# Patient Record
Sex: Male | Born: 1945 | Race: White | Hispanic: No | Marital: Married | Smoking: Never smoker
Health system: Southern US, Community
[De-identification: ages and names within clinical notes are randomized; demographics above are authoritative.]

## PROBLEM LIST (undated history)

## (undated) DIAGNOSIS — R011 Cardiac murmur, unspecified: Secondary | ICD-10-CM

## (undated) DIAGNOSIS — G40909 Epilepsy, unspecified, not intractable, without status epilepticus: Secondary | ICD-10-CM

## (undated) DIAGNOSIS — R972 Elevated prostate specific antigen [PSA]: Secondary | ICD-10-CM

## (undated) DIAGNOSIS — E039 Hypothyroidism, unspecified: Secondary | ICD-10-CM

## (undated) DIAGNOSIS — I4891 Unspecified atrial fibrillation: Secondary | ICD-10-CM

## (undated) DIAGNOSIS — E785 Hyperlipidemia, unspecified: Secondary | ICD-10-CM

## (undated) DIAGNOSIS — N400 Enlarged prostate without lower urinary tract symptoms: Secondary | ICD-10-CM

## (undated) DIAGNOSIS — N200 Calculus of kidney: Secondary | ICD-10-CM

## (undated) DIAGNOSIS — K219 Gastro-esophageal reflux disease without esophagitis: Secondary | ICD-10-CM

## (undated) DIAGNOSIS — D510 Vitamin B12 deficiency anemia due to intrinsic factor deficiency: Secondary | ICD-10-CM

## (undated) HISTORY — DX: Unspecified atrial fibrillation: I48.91

## (undated) HISTORY — PX: OTHER SURGICAL HISTORY: SHX169

## (undated) HISTORY — DX: Vitamin B12 deficiency anemia due to intrinsic factor deficiency: D51.0

## (undated) HISTORY — DX: Elevated prostate specific antigen (PSA): R97.20

## (undated) HISTORY — DX: Cardiac murmur, unspecified: R01.1

## (undated) HISTORY — PX: POLYPECTOMY: SHX149

## (undated) HISTORY — DX: Calculus of kidney: N20.0

---

## 2013-07-04 DIAGNOSIS — G40209 Localization-related (focal) (partial) symptomatic epilepsy and epileptic syndromes with complex partial seizures, not intractable, without status epilepticus: Secondary | ICD-10-CM

## 2013-07-04 HISTORY — DX: Localization-related (focal) (partial) symptomatic epilepsy and epileptic syndromes with complex partial seizures, not intractable, without status epilepticus: G40.209

## 2013-09-09 DIAGNOSIS — D234 Other benign neoplasm of skin of scalp and neck: Secondary | ICD-10-CM | POA: Diagnosis not present

## 2013-09-09 DIAGNOSIS — D1801 Hemangioma of skin and subcutaneous tissue: Secondary | ICD-10-CM | POA: Diagnosis not present

## 2013-09-09 DIAGNOSIS — C44611 Basal cell carcinoma of skin of unspecified upper limb, including shoulder: Secondary | ICD-10-CM | POA: Diagnosis not present

## 2013-09-09 DIAGNOSIS — L02818 Cutaneous abscess of other sites: Secondary | ICD-10-CM | POA: Diagnosis not present

## 2013-09-09 DIAGNOSIS — M999 Biomechanical lesion, unspecified: Secondary | ICD-10-CM | POA: Diagnosis not present

## 2013-09-09 DIAGNOSIS — L03818 Cellulitis of other sites: Secondary | ICD-10-CM | POA: Diagnosis not present

## 2013-09-09 DIAGNOSIS — L570 Actinic keratosis: Secondary | ICD-10-CM | POA: Diagnosis not present

## 2014-01-05 DIAGNOSIS — M999 Biomechanical lesion, unspecified: Secondary | ICD-10-CM | POA: Diagnosis not present

## 2014-01-07 DIAGNOSIS — M999 Biomechanical lesion, unspecified: Secondary | ICD-10-CM | POA: Diagnosis not present

## 2014-01-20 ENCOUNTER — Emergency Department (HOSPITAL_BASED_OUTPATIENT_CLINIC_OR_DEPARTMENT_OTHER)
Admission: EM | Admit: 2014-01-20 | Discharge: 2014-01-20 | Disposition: A | Discharge status: Home / Self Care | Payer: Medicare Other | Attending: Emergency Medicine | Admitting: Emergency Medicine

## 2014-01-20 ENCOUNTER — Encounter (HOSPITAL_BASED_OUTPATIENT_CLINIC_OR_DEPARTMENT_OTHER): Payer: Self-pay | Admitting: Emergency Medicine

## 2014-01-20 DIAGNOSIS — G40909 Epilepsy, unspecified, not intractable, without status epilepticus: Secondary | ICD-10-CM | POA: Diagnosis not present

## 2014-01-20 DIAGNOSIS — S61209A Unspecified open wound of unspecified finger without damage to nail, initial encounter: Secondary | ICD-10-CM | POA: Diagnosis not present

## 2014-01-20 DIAGNOSIS — Z23 Encounter for immunization: Secondary | ICD-10-CM | POA: Diagnosis not present

## 2014-01-20 DIAGNOSIS — Z79899 Other long term (current) drug therapy: Secondary | ICD-10-CM | POA: Diagnosis not present

## 2014-01-20 DIAGNOSIS — Y9389 Activity, other specified: Secondary | ICD-10-CM | POA: Insufficient documentation

## 2014-01-20 DIAGNOSIS — W260XXA Contact with knife, initial encounter: Secondary | ICD-10-CM | POA: Insufficient documentation

## 2014-01-20 DIAGNOSIS — S61219A Laceration without foreign body of unspecified finger without damage to nail, initial encounter: Secondary | ICD-10-CM

## 2014-01-20 DIAGNOSIS — Y929 Unspecified place or not applicable: Secondary | ICD-10-CM | POA: Insufficient documentation

## 2014-01-20 DIAGNOSIS — W261XXA Contact with sword or dagger, initial encounter: Secondary | ICD-10-CM

## 2014-01-20 HISTORY — DX: Epilepsy, unspecified, not intractable, without status epilepticus: G40.909

## 2014-01-20 MED ORDER — TETANUS-DIPHTHERIA TOXOIDS TD 5-2 LFU IM INJ
INJECTION | INTRAMUSCULAR | Status: AC
  Filled 2014-01-20: qty 0.5

## 2014-01-20 MED ORDER — TETANUS-DIPHTH-ACELL PERTUSSIS 5-2.5-18.5 LF-MCG/0.5 IM SUSP
INTRAMUSCULAR | Status: AC
  Filled 2014-01-20: qty 0.5

## 2014-01-20 MED ORDER — TETANUS-DIPHTH-ACELL PERTUSSIS 5-2.5-18.5 LF-MCG/0.5 IM SUSP
0.5000 mL | Freq: Once | INTRAMUSCULAR | Status: AC
  Administered 2014-01-20: 0.5 mL via INTRAMUSCULAR
  Filled 2014-01-20: qty 0.5

## 2014-01-20 NOTE — ED Notes (Signed)
Pt sliced tip of right index finger with a knife this a.m. Bleeding controlled at this time.

## 2014-01-20 NOTE — Discharge Instructions (Signed)
Laceration Care, Adult °A laceration is a cut that goes through all layers of the skin. The cut goes into the tissue beneath the skin. °HOME CARE °For stitches (sutures) or staples: °· Keep the cut clean and dry. °· If you have a bandage (dressing), change it at least once a day. Change the bandage if it gets wet or dirty, or as told by your doctor. °· Wash the cut with soap and water 2 times a day. Rinse the cut with water. Pat it dry with a clean towel. °· Put a thin layer of medicated cream on the cut as told by your doctor. °· You may shower after the first 24 hours. Do not soak the cut in water until the stitches are removed. °· Only take medicines as told by your doctor. °· Have your stitches or staples removed as told by your doctor. °For skin adhesive strips: °· Keep the cut clean and dry. °· Do not get the strips wet. You may take a bath, but be careful to keep the cut dry. °· If the cut gets wet, pat it dry with a clean towel. °· The strips will fall off on their own. Do not remove the strips that are still stuck to the cut. °For wound glue: °· You may shower or take baths. Do not soak or scrub the cut. Do not swim. Avoid heavy sweating until the glue falls off on its own. After a shower or bath, pat the cut dry with a clean towel. °· Do not put medicine on your cut until the glue falls off. °· If you have a bandage, do not put tape over the glue. °· Avoid lots of sunlight or tanning lamps until the glue falls off. Put sunscreen on the cut for the first year to reduce your scar. °· The glue will fall off on its own. Do not pick at the glue. °You may need a tetanus shot if: °· You cannot remember when you had your last tetanus shot. °· You have never had a tetanus shot. °If you need a tetanus shot and you choose not to have one, you may get tetanus. Sickness from tetanus can be serious. °GET HELP RIGHT AWAY IF:  °· Your pain does not get better with medicine. °· Your arm, hand, leg, or foot loses feeling  (numbness) or changes color. °· Your cut is bleeding. °· Your joint feels weak, or you cannot use your joint. °· You have painful lumps on your body. °· Your cut is red, puffy (swollen), or painful. °· You have a red line on the skin near the cut. °· You have yellowish-white fluid (pus) coming from the cut. °· You have a fever. °· You have a bad smell coming from the cut or bandage. °· Your cut breaks open before or after stitches are removed. °· You notice something coming out of the cut, such as wood or glass. °· You cannot move a finger or toe. °MAKE SURE YOU:  °· Understand these instructions. °· Will watch your condition. °· Will get help right away if you are not doing well or get worse. °Document Released: 01/24/2008 Document Revised: 10/30/2011 Document Reviewed: 01/31/2011 °[employer]® Patient Information ©2014 [employer]. ° ° ° ° °

## 2014-01-20 NOTE — ED Notes (Signed)
MD at bedside. 

## 2014-01-20 NOTE — ED Provider Notes (Signed)
CSN: [id number]     Arrival date & time 01/20/14  6213 History   First MD Initiated Contact with Patient 01/20/14 [phone]     Chief Complaint  Patient presents with  . Laceration     (Consider location/radiation/quality/duration/timing/severity/associated sxs/prior Treatment) Patient is a 68 y.o. male presenting with skin laceration. The history is provided by the patient.  Laceration Location:  Hand and finger Hand laceration location:  L hand Length (cm):  0.3 Depth:  Cutaneous Quality: straight   Bleeding: controlled   Pain details:    Severity:  No pain   Progression:  Unchanged Foreign body present:  No foreign bodies Relieved by:  Pressure Worsened by:  Nothing tried Ineffective treatments:  None tried Tetanus status:  Out of date   Past Medical History  Diagnosis Date  . Epilepsy    Past Surgical History  Procedure Laterality Date  . Kidney stone removal     No family history on file. History  Substance Use Topics  . Smoking status: Never Smoker   . Smokeless tobacco: Not on file  . Alcohol Use: No    Review of Systems    Allergies  Review of patient's allergies indicates no known allergies.  Home Medications   Prior to Admission medications   Medication Sig Start Date End Date Taking? Authorizing Provider  PHENObarbital (LUMINAL) 30 MG tablet Take 30 mg by mouth 4 (four) times daily.   Yes Historical Provider, MD  phenytoin (DILANTIN) 100 MG ER capsule Take by mouth 3 (three) times daily.   Yes Historical Provider, MD   BP 130/80  Pulse 95  Temp(Src) 97.5 F (36.4 C) (Oral)  Resp 16  Ht 5\' 11"  (1.803 m)  Wt 185 lb (83.915 kg)  BMI 25.81 kg/m2  SpO2 97% Physical Exam  Nursing note and vitals reviewed. Constitutional: He is oriented to person, place, and time. He appears well-developed and well-nourished.  HENT:  Head: Normocephalic and atraumatic.  Cardiovascular: Normal rate.   Pulmonary/Chest: Effort normal.  Musculoskeletal: He exhibits  no edema and no tenderness.       Hands: Neurological: He is alert and oriented to person, place, and time.  Skin: Skin is warm and dry.  Psychiatric: He has a normal mood and affect.    ED Course  Procedures (including critical care time) Labs Review Labs Reviewed - No data to display  Imaging Review No results found.   EKG Interpretation None      MDM   Final diagnoses:  Finger laceration        Shaune Pollack, MD 01/20/14 1000

## 2014-01-21 DIAGNOSIS — M999 Biomechanical lesion, unspecified: Secondary | ICD-10-CM | POA: Diagnosis not present

## 2014-02-04 DIAGNOSIS — Z23 Encounter for immunization: Secondary | ICD-10-CM | POA: Diagnosis not present

## 2014-02-04 DIAGNOSIS — E7800 Pure hypercholesterolemia, unspecified: Secondary | ICD-10-CM | POA: Diagnosis not present

## 2014-02-04 DIAGNOSIS — Z79899 Other long term (current) drug therapy: Secondary | ICD-10-CM | POA: Diagnosis not present

## 2014-02-04 DIAGNOSIS — R5381 Other malaise: Secondary | ICD-10-CM | POA: Diagnosis not present

## 2014-02-04 DIAGNOSIS — Z125 Encounter for screening for malignant neoplasm of prostate: Secondary | ICD-10-CM | POA: Diagnosis not present

## 2014-03-19 ENCOUNTER — Other Ambulatory Visit (HOSPITAL_COMMUNITY): Payer: Self-pay | Admitting: Urology

## 2014-03-19 DIAGNOSIS — R972 Elevated prostate specific antigen [PSA]: Secondary | ICD-10-CM

## 2014-04-08 ENCOUNTER — Ambulatory Visit (HOSPITAL_COMMUNITY): Payer: Medicare Other

## 2014-04-09 ENCOUNTER — Ambulatory Visit (HOSPITAL_COMMUNITY)
Admission: RE | Admit: 2014-04-09 | Discharge: 2014-04-09 | Disposition: A | Discharge status: Home / Self Care | Payer: Medicare Other | Source: Ambulatory Visit | Attending: Urology | Admitting: Urology

## 2014-04-09 DIAGNOSIS — M899 Disorder of bone, unspecified: Secondary | ICD-10-CM | POA: Diagnosis not present

## 2014-04-09 DIAGNOSIS — R972 Elevated prostate specific antigen [PSA]: Secondary | ICD-10-CM

## 2014-04-09 DIAGNOSIS — M949 Disorder of cartilage, unspecified: Secondary | ICD-10-CM

## 2014-04-09 DIAGNOSIS — N400 Enlarged prostate without lower urinary tract symptoms: Secondary | ICD-10-CM | POA: Insufficient documentation

## 2014-04-09 MED ORDER — GADOBENATE DIMEGLUMINE 529 MG/ML IV SOLN
19.0000 mL | Freq: Once | INTRAVENOUS | Status: AC | PRN
  Administered 2014-04-09: 19 mL via INTRAVENOUS

## 2014-04-10 DIAGNOSIS — D219 Benign neoplasm of connective and other soft tissue, unspecified: Secondary | ICD-10-CM | POA: Diagnosis not present

## 2014-04-10 DIAGNOSIS — H9319 Tinnitus, unspecified ear: Secondary | ICD-10-CM | POA: Diagnosis not present

## 2014-04-10 DIAGNOSIS — J342 Deviated nasal septum: Secondary | ICD-10-CM | POA: Diagnosis not present

## 2014-04-10 DIAGNOSIS — H9120 Sudden idiopathic hearing loss, unspecified ear: Secondary | ICD-10-CM | POA: Diagnosis not present

## 2014-04-14 DIAGNOSIS — H9319 Tinnitus, unspecified ear: Secondary | ICD-10-CM | POA: Diagnosis not present

## 2014-04-14 DIAGNOSIS — H903 Sensorineural hearing loss, bilateral: Secondary | ICD-10-CM | POA: Diagnosis not present

## 2014-04-15 DIAGNOSIS — R972 Elevated prostate specific antigen [PSA]: Secondary | ICD-10-CM | POA: Diagnosis not present

## 2014-04-15 DIAGNOSIS — N401 Enlarged prostate with lower urinary tract symptoms: Secondary | ICD-10-CM | POA: Insufficient documentation

## 2014-04-15 DIAGNOSIS — N138 Other obstructive and reflux uropathy: Secondary | ICD-10-CM

## 2014-04-15 HISTORY — DX: Benign prostatic hyperplasia with lower urinary tract symptoms: N13.8

## 2014-05-07 DIAGNOSIS — N429 Disorder of prostate, unspecified: Secondary | ICD-10-CM | POA: Diagnosis not present

## 2014-05-07 DIAGNOSIS — Z0389 Encounter for observation for other suspected diseases and conditions ruled out: Secondary | ICD-10-CM | POA: Diagnosis not present

## 2014-05-07 DIAGNOSIS — R972 Elevated prostate specific antigen [PSA]: Secondary | ICD-10-CM | POA: Diagnosis not present

## 2014-05-20 DIAGNOSIS — R972 Elevated prostate specific antigen [PSA]: Secondary | ICD-10-CM | POA: Diagnosis not present

## 2014-05-21 DIAGNOSIS — Z23 Encounter for immunization: Secondary | ICD-10-CM | POA: Diagnosis not present

## 2014-07-03 DIAGNOSIS — G40209 Localization-related (focal) (partial) symptomatic epilepsy and epileptic syndromes with complex partial seizures, not intractable, without status epilepticus: Secondary | ICD-10-CM | POA: Diagnosis not present

## 2014-09-09 DIAGNOSIS — C44612 Basal cell carcinoma of skin of right upper limb, including shoulder: Secondary | ICD-10-CM | POA: Diagnosis not present

## 2014-09-09 DIAGNOSIS — L578 Other skin changes due to chronic exposure to nonionizing radiation: Secondary | ICD-10-CM | POA: Diagnosis not present

## 2014-09-09 DIAGNOSIS — L821 Other seborrheic keratosis: Secondary | ICD-10-CM | POA: Diagnosis not present

## 2014-09-09 DIAGNOSIS — L820 Inflamed seborrheic keratosis: Secondary | ICD-10-CM | POA: Diagnosis not present

## 2014-09-14 DIAGNOSIS — M9905 Segmental and somatic dysfunction of pelvic region: Secondary | ICD-10-CM | POA: Diagnosis not present

## 2014-09-14 DIAGNOSIS — M9904 Segmental and somatic dysfunction of sacral region: Secondary | ICD-10-CM | POA: Diagnosis not present

## 2014-09-14 DIAGNOSIS — M9903 Segmental and somatic dysfunction of lumbar region: Secondary | ICD-10-CM | POA: Diagnosis not present

## 2014-09-14 DIAGNOSIS — M9902 Segmental and somatic dysfunction of thoracic region: Secondary | ICD-10-CM | POA: Diagnosis not present

## 2014-09-15 DIAGNOSIS — M9905 Segmental and somatic dysfunction of pelvic region: Secondary | ICD-10-CM | POA: Diagnosis not present

## 2014-09-15 DIAGNOSIS — M9903 Segmental and somatic dysfunction of lumbar region: Secondary | ICD-10-CM | POA: Diagnosis not present

## 2014-09-15 DIAGNOSIS — M9904 Segmental and somatic dysfunction of sacral region: Secondary | ICD-10-CM | POA: Diagnosis not present

## 2014-09-15 DIAGNOSIS — M9902 Segmental and somatic dysfunction of thoracic region: Secondary | ICD-10-CM | POA: Diagnosis not present

## 2014-10-12 DIAGNOSIS — B9689 Other specified bacterial agents as the cause of diseases classified elsewhere: Secondary | ICD-10-CM | POA: Diagnosis not present

## 2014-10-12 DIAGNOSIS — J019 Acute sinusitis, unspecified: Secondary | ICD-10-CM | POA: Diagnosis not present

## 2014-10-26 DIAGNOSIS — Z129 Encounter for screening for malignant neoplasm, site unspecified: Secondary | ICD-10-CM | POA: Diagnosis not present

## 2014-10-29 DIAGNOSIS — Z6825 Body mass index (BMI) 25.0-25.9, adult: Secondary | ICD-10-CM | POA: Diagnosis not present

## 2014-10-29 DIAGNOSIS — R972 Elevated prostate specific antigen [PSA]: Secondary | ICD-10-CM | POA: Diagnosis not present

## 2015-01-25 DIAGNOSIS — M9903 Segmental and somatic dysfunction of lumbar region: Secondary | ICD-10-CM | POA: Diagnosis not present

## 2015-01-25 DIAGNOSIS — M9902 Segmental and somatic dysfunction of thoracic region: Secondary | ICD-10-CM | POA: Diagnosis not present

## 2015-01-25 DIAGNOSIS — M9905 Segmental and somatic dysfunction of pelvic region: Secondary | ICD-10-CM | POA: Diagnosis not present

## 2015-01-25 DIAGNOSIS — M9904 Segmental and somatic dysfunction of sacral region: Secondary | ICD-10-CM | POA: Diagnosis not present

## 2015-02-02 DIAGNOSIS — M9903 Segmental and somatic dysfunction of lumbar region: Secondary | ICD-10-CM | POA: Diagnosis not present

## 2015-02-02 DIAGNOSIS — M9904 Segmental and somatic dysfunction of sacral region: Secondary | ICD-10-CM | POA: Diagnosis not present

## 2015-02-02 DIAGNOSIS — M9902 Segmental and somatic dysfunction of thoracic region: Secondary | ICD-10-CM | POA: Diagnosis not present

## 2015-02-02 DIAGNOSIS — M9905 Segmental and somatic dysfunction of pelvic region: Secondary | ICD-10-CM | POA: Diagnosis not present

## 2015-02-03 DIAGNOSIS — M9905 Segmental and somatic dysfunction of pelvic region: Secondary | ICD-10-CM | POA: Diagnosis not present

## 2015-02-03 DIAGNOSIS — M9904 Segmental and somatic dysfunction of sacral region: Secondary | ICD-10-CM | POA: Diagnosis not present

## 2015-02-03 DIAGNOSIS — M9902 Segmental and somatic dysfunction of thoracic region: Secondary | ICD-10-CM | POA: Diagnosis not present

## 2015-02-03 DIAGNOSIS — M9903 Segmental and somatic dysfunction of lumbar region: Secondary | ICD-10-CM | POA: Diagnosis not present

## 2015-02-05 DIAGNOSIS — M9905 Segmental and somatic dysfunction of pelvic region: Secondary | ICD-10-CM | POA: Diagnosis not present

## 2015-02-05 DIAGNOSIS — M9902 Segmental and somatic dysfunction of thoracic region: Secondary | ICD-10-CM | POA: Diagnosis not present

## 2015-02-05 DIAGNOSIS — M9903 Segmental and somatic dysfunction of lumbar region: Secondary | ICD-10-CM | POA: Diagnosis not present

## 2015-02-05 DIAGNOSIS — M9904 Segmental and somatic dysfunction of sacral region: Secondary | ICD-10-CM | POA: Diagnosis not present

## 2015-03-08 DIAGNOSIS — L720 Epidermal cyst: Secondary | ICD-10-CM | POA: Diagnosis not present

## 2015-03-08 DIAGNOSIS — L821 Other seborrheic keratosis: Secondary | ICD-10-CM | POA: Diagnosis not present

## 2015-04-23 DIAGNOSIS — M9905 Segmental and somatic dysfunction of pelvic region: Secondary | ICD-10-CM | POA: Diagnosis not present

## 2015-04-23 DIAGNOSIS — M9902 Segmental and somatic dysfunction of thoracic region: Secondary | ICD-10-CM | POA: Diagnosis not present

## 2015-04-23 DIAGNOSIS — M9904 Segmental and somatic dysfunction of sacral region: Secondary | ICD-10-CM | POA: Diagnosis not present

## 2015-04-23 DIAGNOSIS — M9903 Segmental and somatic dysfunction of lumbar region: Secondary | ICD-10-CM | POA: Diagnosis not present

## 2015-07-19 DIAGNOSIS — E7800 Pure hypercholesterolemia, unspecified: Secondary | ICD-10-CM | POA: Diagnosis not present

## 2015-07-19 DIAGNOSIS — R5383 Other fatigue: Secondary | ICD-10-CM | POA: Diagnosis not present

## 2015-07-19 DIAGNOSIS — J111 Influenza due to unidentified influenza virus with other respiratory manifestations: Secondary | ICD-10-CM | POA: Diagnosis not present

## 2015-07-22 DIAGNOSIS — Z6825 Body mass index (BMI) 25.0-25.9, adult: Secondary | ICD-10-CM | POA: Diagnosis not present

## 2015-07-22 DIAGNOSIS — G40209 Localization-related (focal) (partial) symptomatic epilepsy and epileptic syndromes with complex partial seizures, not intractable, without status epilepticus: Secondary | ICD-10-CM | POA: Diagnosis not present

## 2015-08-26 DIAGNOSIS — Z23 Encounter for immunization: Secondary | ICD-10-CM | POA: Diagnosis not present

## 2015-09-10 DIAGNOSIS — D1801 Hemangioma of skin and subcutaneous tissue: Secondary | ICD-10-CM | POA: Diagnosis not present

## 2015-09-10 DIAGNOSIS — L720 Epidermal cyst: Secondary | ICD-10-CM | POA: Diagnosis not present

## 2015-09-10 DIAGNOSIS — M9905 Segmental and somatic dysfunction of pelvic region: Secondary | ICD-10-CM | POA: Diagnosis not present

## 2015-09-10 DIAGNOSIS — M9902 Segmental and somatic dysfunction of thoracic region: Secondary | ICD-10-CM | POA: Diagnosis not present

## 2015-09-10 DIAGNOSIS — L570 Actinic keratosis: Secondary | ICD-10-CM | POA: Diagnosis not present

## 2015-09-10 DIAGNOSIS — M9903 Segmental and somatic dysfunction of lumbar region: Secondary | ICD-10-CM | POA: Diagnosis not present

## 2015-09-10 DIAGNOSIS — L578 Other skin changes due to chronic exposure to nonionizing radiation: Secondary | ICD-10-CM | POA: Diagnosis not present

## 2015-09-10 DIAGNOSIS — M9904 Segmental and somatic dysfunction of sacral region: Secondary | ICD-10-CM | POA: Diagnosis not present

## 2015-09-10 DIAGNOSIS — L821 Other seborrheic keratosis: Secondary | ICD-10-CM | POA: Diagnosis not present

## 2015-09-17 DIAGNOSIS — R972 Elevated prostate specific antigen [PSA]: Secondary | ICD-10-CM | POA: Diagnosis not present

## 2015-09-17 DIAGNOSIS — N401 Enlarged prostate with lower urinary tract symptoms: Secondary | ICD-10-CM | POA: Diagnosis not present

## 2015-09-17 DIAGNOSIS — Z6825 Body mass index (BMI) 25.0-25.9, adult: Secondary | ICD-10-CM | POA: Diagnosis not present

## 2015-09-17 DIAGNOSIS — N138 Other obstructive and reflux uropathy: Secondary | ICD-10-CM | POA: Insufficient documentation

## 2015-11-10 DIAGNOSIS — Z79899 Other long term (current) drug therapy: Secondary | ICD-10-CM | POA: Diagnosis not present

## 2015-11-10 DIAGNOSIS — Z0000 Encounter for general adult medical examination without abnormal findings: Secondary | ICD-10-CM | POA: Diagnosis not present

## 2015-11-10 DIAGNOSIS — E785 Hyperlipidemia, unspecified: Secondary | ICD-10-CM | POA: Diagnosis not present

## 2015-11-10 DIAGNOSIS — R972 Elevated prostate specific antigen [PSA]: Secondary | ICD-10-CM | POA: Diagnosis not present

## 2015-11-10 DIAGNOSIS — Z23 Encounter for immunization: Secondary | ICD-10-CM | POA: Diagnosis not present

## 2015-11-15 DIAGNOSIS — M9902 Segmental and somatic dysfunction of thoracic region: Secondary | ICD-10-CM | POA: Diagnosis not present

## 2015-11-15 DIAGNOSIS — M9905 Segmental and somatic dysfunction of pelvic region: Secondary | ICD-10-CM | POA: Diagnosis not present

## 2015-11-15 DIAGNOSIS — M9903 Segmental and somatic dysfunction of lumbar region: Secondary | ICD-10-CM | POA: Diagnosis not present

## 2015-11-15 DIAGNOSIS — M9904 Segmental and somatic dysfunction of sacral region: Secondary | ICD-10-CM | POA: Diagnosis not present

## 2015-12-07 DIAGNOSIS — M9902 Segmental and somatic dysfunction of thoracic region: Secondary | ICD-10-CM | POA: Diagnosis not present

## 2015-12-07 DIAGNOSIS — M9903 Segmental and somatic dysfunction of lumbar region: Secondary | ICD-10-CM | POA: Diagnosis not present

## 2015-12-07 DIAGNOSIS — M9904 Segmental and somatic dysfunction of sacral region: Secondary | ICD-10-CM | POA: Diagnosis not present

## 2015-12-07 DIAGNOSIS — M9905 Segmental and somatic dysfunction of pelvic region: Secondary | ICD-10-CM | POA: Diagnosis not present

## 2016-01-06 DIAGNOSIS — L270 Generalized skin eruption due to drugs and medicaments taken internally: Secondary | ICD-10-CM | POA: Diagnosis not present

## 2016-01-26 DIAGNOSIS — M9903 Segmental and somatic dysfunction of lumbar region: Secondary | ICD-10-CM | POA: Diagnosis not present

## 2016-01-26 DIAGNOSIS — M9904 Segmental and somatic dysfunction of sacral region: Secondary | ICD-10-CM | POA: Diagnosis not present

## 2016-01-26 DIAGNOSIS — M9902 Segmental and somatic dysfunction of thoracic region: Secondary | ICD-10-CM | POA: Diagnosis not present

## 2016-01-26 DIAGNOSIS — M9905 Segmental and somatic dysfunction of pelvic region: Secondary | ICD-10-CM | POA: Diagnosis not present

## 2016-02-01 DIAGNOSIS — M9904 Segmental and somatic dysfunction of sacral region: Secondary | ICD-10-CM | POA: Diagnosis not present

## 2016-02-01 DIAGNOSIS — M9903 Segmental and somatic dysfunction of lumbar region: Secondary | ICD-10-CM | POA: Diagnosis not present

## 2016-02-01 DIAGNOSIS — M9905 Segmental and somatic dysfunction of pelvic region: Secondary | ICD-10-CM | POA: Diagnosis not present

## 2016-02-01 DIAGNOSIS — M9902 Segmental and somatic dysfunction of thoracic region: Secondary | ICD-10-CM | POA: Diagnosis not present

## 2016-02-04 DIAGNOSIS — M9903 Segmental and somatic dysfunction of lumbar region: Secondary | ICD-10-CM | POA: Diagnosis not present

## 2016-02-04 DIAGNOSIS — M9902 Segmental and somatic dysfunction of thoracic region: Secondary | ICD-10-CM | POA: Diagnosis not present

## 2016-02-04 DIAGNOSIS — M9904 Segmental and somatic dysfunction of sacral region: Secondary | ICD-10-CM | POA: Diagnosis not present

## 2016-02-04 DIAGNOSIS — M9905 Segmental and somatic dysfunction of pelvic region: Secondary | ICD-10-CM | POA: Diagnosis not present

## 2016-03-17 DIAGNOSIS — M9902 Segmental and somatic dysfunction of thoracic region: Secondary | ICD-10-CM | POA: Diagnosis not present

## 2016-03-17 DIAGNOSIS — M9904 Segmental and somatic dysfunction of sacral region: Secondary | ICD-10-CM | POA: Diagnosis not present

## 2016-03-17 DIAGNOSIS — M9903 Segmental and somatic dysfunction of lumbar region: Secondary | ICD-10-CM | POA: Diagnosis not present

## 2016-03-17 DIAGNOSIS — M9905 Segmental and somatic dysfunction of pelvic region: Secondary | ICD-10-CM | POA: Diagnosis not present

## 2016-04-03 DIAGNOSIS — M9903 Segmental and somatic dysfunction of lumbar region: Secondary | ICD-10-CM | POA: Diagnosis not present

## 2016-04-03 DIAGNOSIS — M9902 Segmental and somatic dysfunction of thoracic region: Secondary | ICD-10-CM | POA: Diagnosis not present

## 2016-04-03 DIAGNOSIS — M9904 Segmental and somatic dysfunction of sacral region: Secondary | ICD-10-CM | POA: Diagnosis not present

## 2016-04-03 DIAGNOSIS — M9905 Segmental and somatic dysfunction of pelvic region: Secondary | ICD-10-CM | POA: Diagnosis not present

## 2016-06-14 DIAGNOSIS — L570 Actinic keratosis: Secondary | ICD-10-CM | POA: Diagnosis not present

## 2016-07-06 ENCOUNTER — Encounter: Payer: Self-pay | Admitting: Sports Medicine

## 2016-07-06 ENCOUNTER — Ambulatory Visit (INDEPENDENT_AMBULATORY_CARE_PROVIDER_SITE_OTHER): Payer: Medicare Other

## 2016-07-06 ENCOUNTER — Ambulatory Visit (INDEPENDENT_AMBULATORY_CARE_PROVIDER_SITE_OTHER): Payer: Medicare Other | Admitting: Sports Medicine

## 2016-07-06 DIAGNOSIS — M79671 Pain in right foot: Secondary | ICD-10-CM | POA: Diagnosis not present

## 2016-07-06 DIAGNOSIS — M19079 Primary osteoarthritis, unspecified ankle and foot: Secondary | ICD-10-CM | POA: Diagnosis not present

## 2016-07-06 DIAGNOSIS — M779 Enthesopathy, unspecified: Secondary | ICD-10-CM

## 2016-07-06 DIAGNOSIS — M7750 Other enthesopathy of unspecified foot: Secondary | ICD-10-CM | POA: Diagnosis not present

## 2016-07-06 NOTE — Patient Instructions (Signed)
Recommend Rest, epsom soaks, ice, elevation, and protection with compression sleeve Avoid activities that will irritate your foot Return to office if not improved

## 2016-07-06 NOTE — Progress Notes (Signed)
Subjective: Edward Boone is a 70 y.o. male patient who presents to office for evaluation of right foot pain. Patient complains of progressive pain that is now better since yesterday. States that he was in the yard raking leaves and afterwards had intense pain along the side of the right foot. States that he has done nothing for the pain, however, pain is now better now 4/10. Patient denies any other pedal complaints. Denies any other causative factors.   Patient Active Problem List   Diagnosis Date Noted  . Benign prostatic hyperplasia with urinary obstruction 09/17/2015  . Hypertrophy of prostate with urinary obstruction and other lower urinary tract symptoms (LUTS) 04/15/2014  . Elevated PSA 03/19/2014  . 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   No current facility-administered medications on file prior to visit.     No Known Allergies  Objective:  General: Alert and oriented x3 in no acute distress  Dermatology: No open lesions bilateral lower extremities, no webspace macerations, no ecchymosis bilateral, all nails x 10 are well manicured.  Vascular: Dorsalis Pedis and Posterior Tibial pedal pulses palpable, Capillary Fill Time 3 seconds,Scant pedal hair growth bilateral, minimal edema bilateral lower extremities, varicosities bilateral, Temperature gradient within normal limits.  Neurology: Edward Boone sensation intact via light touch bilateral (.- )Tinels sign bilateral.   Musculoskeletal: Mild tenderness with palpation atperoneal brevis insertion and fifth metatarsal base on right foot. There is no pain with calf compression bilateral. There is decreased  midtarsal and ankle rom with knee extending  vs flexed resembling gastroc equnius bilateral, Subtalar joint range of motion is within normal limits, there isno reproducible pain with stressing midtarsal joint. However, a little bit of tenderness  with inversion on right foot.Strength within normal limits in all groups bilateral.    Xrays   Right Foot   Impression: normal osseous mineralization with joint space narrowing at midtarsal joint and diffuse arthritis especially involved at the fifth metatarsal base with history of enthesopathy, no fracture or frank dislocation, soft tissue margins within normal limits.   Assessment and Plan: Problem List Items Addressed This Visit    None    Visit Diagnoses    Right foot pain    -  Primary   Relevant Orders   DG Foot 2 Views Right   Tendonitis       Arthritis of foot          -Complete examination performed -Xrays reviewed -Discussed treatement options for likely tendinitis secondary to position that he was standing in while raking leaves  -Patient declined medication or steroid injection to site  -Patient declined taping or bracing  -Recommend Rest, epsom soaks, ice, elevation, and protection with compression sleeve of which surgi tube was given -Recommend to avoid activities that will irritate foot, especially uneven surfaces and strenuous exercise until symptoms are resolved -Patient to return to office as needed or sooner if condition worsens.  Landis Martins, DPM

## 2016-07-20 DIAGNOSIS — Z6825 Body mass index (BMI) 25.0-25.9, adult: Secondary | ICD-10-CM | POA: Diagnosis not present

## 2016-07-20 DIAGNOSIS — Z79899 Other long term (current) drug therapy: Secondary | ICD-10-CM | POA: Diagnosis not present

## 2016-07-20 DIAGNOSIS — G40209 Localization-related (focal) (partial) symptomatic epilepsy and epileptic syndromes with complex partial seizures, not intractable, without status epilepticus: Secondary | ICD-10-CM | POA: Diagnosis not present

## 2016-07-26 ENCOUNTER — Ambulatory Visit (INDEPENDENT_AMBULATORY_CARE_PROVIDER_SITE_OTHER): Payer: Medicare Other | Admitting: Sports Medicine

## 2016-07-26 ENCOUNTER — Encounter: Payer: Self-pay | Admitting: Sports Medicine

## 2016-07-26 DIAGNOSIS — M79671 Pain in right foot: Secondary | ICD-10-CM

## 2016-07-26 DIAGNOSIS — R609 Edema, unspecified: Secondary | ICD-10-CM

## 2016-07-26 DIAGNOSIS — M7750 Other enthesopathy of unspecified foot: Secondary | ICD-10-CM | POA: Diagnosis not present

## 2016-07-26 DIAGNOSIS — M779 Enthesopathy, unspecified: Secondary | ICD-10-CM

## 2016-07-26 DIAGNOSIS — Z79899 Other long term (current) drug therapy: Secondary | ICD-10-CM | POA: Diagnosis not present

## 2016-07-26 MED ORDER — TRIAMCINOLONE ACETONIDE 40 MG/ML IJ SUSP: 20.0000 mg | Freq: Once | INTRAMUSCULAR | Status: DC

## 2016-07-26 NOTE — Progress Notes (Signed)
  Subjective: Edward Boone is a 70 y.o. male patient who presents to office for follow up evaluation of right foot pain, request injection, States that he was raking the leaves again and hit his foot and now has has pain and swelling, pain now 5/10. Patient denies any other pedal complaints.    Patient Active Problem List   Diagnosis Date Noted  . Benign prostatic hyperplasia with urinary obstruction 09/17/2015  . Hypertrophy of prostate with urinary obstruction and other lower urinary tract symptoms (LUTS) 04/15/2014  . Elevated PSA 03/19/2014  . 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 dutasteride (AVODART) 0.5 MG capsule     . finasteride (PROSCAR) 5 MG tablet     . Influenza Vac Split High-Dose 0.5 ML SUSY     . levothyroxine (SYNTHROID, LEVOTHROID) 25 MCG tablet Frequency:daily   Dosage:25   MCG  Instructions:Levothyroxine Sodium (25MCG TABS, 1 Oral daily)  Note:    . PHENObarbital (LUMINAL) 30 MG tablet Take 30 mg by mouth.    . phenytoin (DILANTIN) 100 MG ER capsule Take 300 mg by mouth.     No current facility-administered medications on file prior to visit.     No Known Allergies  Objective:  General: Alert and oriented x3 in no acute distress  Dermatology: No open lesions bilateral lower extremities, no webspace macerations, no ecchymosis bilateral, all nails x 10 are well manicured.  Vascular: Dorsalis Pedis and Posterior Tibial pedal pulses palpable, Capillary Fill Time 3 seconds,Scant pedal hair growth bilateral, minimal edema bilateral lower extremities R>L, varicosities bilateral, Temperature gradient within normal limits.  Neurology: [name] sensation intact via light touch bilateral (.- )Tinels sign bilateral.   Musculoskeletal: Mild tenderness with palpation at peroneal brevis insertion and fifth metatarsal base on right foot. There is no pain  with calf compression bilateral. There is decreased  midtarsal and ankle rom with knee extending  vs flexed resembling gastroc equnius bilateral, Subtalar joint range of motion is within normal limits, there isno reproducible pain with stressing midtarsal joint. However, a little bit of tenderness with inversion on right foot.Strength within normal limits in all groups bilateral.   Assessment and Plan: Problem List Items Addressed This Visit    None    Visit Diagnoses    Tendonitis    -  Primary   Relevant Medications   triamcinolone acetonide (KENALOG-40) injection 20 mg   Right foot pain       Relevant Medications   triamcinolone acetonide (KENALOG-40) injection 20 mg   Swelling          -Complete examination performed -Previous Xrays reviewed -Discussed treatement options for likely tendinitis secondary inversion and direct stub injury -After oral consent and aseptic prep, injected a mixture containing 1 ml of 2% plain lidocaine, 1 ml 0.5% plain marcaine, 0.5 ml of kenalog 40 and 0.5 ml of dexamethasone phosphate into Right 5th met base at area of most pain- without complication. Post-injection care discussed with patient.  -Applied unna boot to keep intact for 5-6 days on right foot -Dispensed post op shoe -Recommend Rest, ice, elevation, and protection  -Recommend to avoid activities that will irritate foot, especially uneven surfaces and strenuous exercise until symptoms are resolved -Patient to return to office in 2 weeks or sooner if condition worsens.  Landis Martins, DPM

## 2016-07-26 NOTE — Patient Instructions (Signed)
STRAPPING INSTRUCTIONS  Strapping's need to be worn for 5-7 days for maximum benefit.  If you next appointment is before 5 days, please remove prior to coming in.  Try to avoid putting lotion on feet during the unna boot/ taping process.  It is important that you wear sturdy shoes at all times when walking.  Bedroom shoes, slippers, flip flops, etc. are not acceptable.  **If at any time while wearing the strapping you should notice any irritation such as a rash, redness, or itching, remove the wrap and wash your foot/feet thoroughly.  BATHING INSTRUCTIONS  If your foot gets wet, take a towel and absorb as much of the water as possible. You can also take a blow dryer, put it on low heat, and dry your bandage.

## 2016-08-09 ENCOUNTER — Encounter: Payer: Self-pay | Admitting: Sports Medicine

## 2016-08-09 ENCOUNTER — Ambulatory Visit (INDEPENDENT_AMBULATORY_CARE_PROVIDER_SITE_OTHER): Payer: Medicare Other | Admitting: Sports Medicine

## 2016-08-09 DIAGNOSIS — M19079 Primary osteoarthritis, unspecified ankle and foot: Secondary | ICD-10-CM | POA: Diagnosis not present

## 2016-08-09 DIAGNOSIS — M779 Enthesopathy, unspecified: Secondary | ICD-10-CM | POA: Diagnosis not present

## 2016-08-09 DIAGNOSIS — Z23 Encounter for immunization: Secondary | ICD-10-CM | POA: Diagnosis not present

## 2016-08-09 DIAGNOSIS — M79671 Pain in right foot: Secondary | ICD-10-CM

## 2016-08-09 NOTE — Progress Notes (Signed)
  Subjective: Edward Boone is a 70 y.o. male patient who presents to office for follow up evaluation of right foot pain, states that he is doing better and pain is better. Wore unna boot for 10 days. Patient denies any other pedal complaints.    Patient Active Problem List   Diagnosis Date Noted  . Benign prostatic hyperplasia with urinary obstruction 09/17/2015  . Hypertrophy of prostate with urinary obstruction and other lower urinary tract symptoms (LUTS) 04/15/2014  . Elevated PSA 03/19/2014  . 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 dutasteride (AVODART) 0.5 MG capsule     . finasteride (PROSCAR) 5 MG tablet     . Influenza Vac Split High-Dose 0.5 ML SUSY     . levothyroxine (SYNTHROID, LEVOTHROID) 25 MCG tablet Frequency:daily   Dosage:25   MCG  Instructions:Levothyroxine Sodium (25MCG TABS, 1 Oral daily)  Note:    . PHENObarbital (LUMINAL) 30 MG tablet Take 30 mg by mouth.    . phenytoin (DILANTIN) 100 MG ER capsule Take 300 mg by mouth.     Current Facility-Administered Medications on File Prior to Visit  Medication Dose Route Frequency Provider Last Rate Last Dose  . triamcinolone acetonide (KENALOG-40) injection 20 mg  20 mg Other Once [employer], DPM        No Known Allergies  Objective:  General: Alert and oriented x3 in no acute distress  Dermatology: No open lesions bilateral lower extremities, no webspace macerations, no ecchymosis bilateral, all nails x 10 are well manicured.  Vascular: Dorsalis Pedis and Posterior Tibial pedal pulses palpable, Capillary Fill Time 3 seconds,Scant pedal hair growth bilateral, improved edema bilateral lower extremities R>L, varicosities bilateral, Temperature gradient within normal limits.  Neurology: [name] sensation intact via light touch bilateral (.- )Tinels sign bilateral.   Musculoskeletal: No tenderness  with palpation at peroneal brevis insertion and fifth metatarsal base on right foot. There is no pain with calf compression bilateral. There is decreased  midtarsal and ankle rom with knee extending  vs flexed resembling gastroc equnius bilateral, Subtalar joint range of motion is within normal limits, there is no reproducible pain with stressing midtarsal joint. Strength within normal limits in all groups bilateral.   Assessment and Plan: Problem List Items Addressed This Visit    None    Visit Diagnoses    Tendonitis    -  Primary   Right foot pain       Arthritis of foot          -Complete examination performed -Discussed long term care for tendinitis -Recommend daily stretching -Recommend Rest, ice, elevation, and protection -May use tens unit that he has at [facility]  -Recommend to avoid activities that will irritate foot, especially uneven surfaces and strenuous exercise until symptoms are resolved -Patient to return to office as needed or sooner if condition worsens.  Landis Martins, DPM

## 2016-10-18 DIAGNOSIS — H8113 Benign paroxysmal vertigo, bilateral: Secondary | ICD-10-CM | POA: Diagnosis not present

## 2016-10-18 DIAGNOSIS — R030 Elevated blood-pressure reading, without diagnosis of hypertension: Secondary | ICD-10-CM | POA: Diagnosis not present

## 2016-10-31 DIAGNOSIS — M9904 Segmental and somatic dysfunction of sacral region: Secondary | ICD-10-CM | POA: Diagnosis not present

## 2016-10-31 DIAGNOSIS — M9902 Segmental and somatic dysfunction of thoracic region: Secondary | ICD-10-CM | POA: Diagnosis not present

## 2016-10-31 DIAGNOSIS — M9903 Segmental and somatic dysfunction of lumbar region: Secondary | ICD-10-CM | POA: Diagnosis not present

## 2016-10-31 DIAGNOSIS — M9905 Segmental and somatic dysfunction of pelvic region: Secondary | ICD-10-CM | POA: Diagnosis not present

## 2016-11-24 DIAGNOSIS — Z6827 Body mass index (BMI) 27.0-27.9, adult: Secondary | ICD-10-CM | POA: Diagnosis not present

## 2016-11-24 DIAGNOSIS — J40 Bronchitis, not specified as acute or chronic: Secondary | ICD-10-CM | POA: Diagnosis not present

## 2016-12-05 DIAGNOSIS — M9902 Segmental and somatic dysfunction of thoracic region: Secondary | ICD-10-CM | POA: Diagnosis not present

## 2016-12-05 DIAGNOSIS — M9905 Segmental and somatic dysfunction of pelvic region: Secondary | ICD-10-CM | POA: Diagnosis not present

## 2016-12-05 DIAGNOSIS — M9903 Segmental and somatic dysfunction of lumbar region: Secondary | ICD-10-CM | POA: Diagnosis not present

## 2016-12-05 DIAGNOSIS — M9904 Segmental and somatic dysfunction of sacral region: Secondary | ICD-10-CM | POA: Diagnosis not present

## 2017-01-18 DIAGNOSIS — R972 Elevated prostate specific antigen [PSA]: Secondary | ICD-10-CM | POA: Diagnosis not present

## 2017-02-26 DIAGNOSIS — M9902 Segmental and somatic dysfunction of thoracic region: Secondary | ICD-10-CM | POA: Diagnosis not present

## 2017-02-26 DIAGNOSIS — M9903 Segmental and somatic dysfunction of lumbar region: Secondary | ICD-10-CM | POA: Diagnosis not present

## 2017-02-26 DIAGNOSIS — M9904 Segmental and somatic dysfunction of sacral region: Secondary | ICD-10-CM | POA: Diagnosis not present

## 2017-02-26 DIAGNOSIS — M9905 Segmental and somatic dysfunction of pelvic region: Secondary | ICD-10-CM | POA: Diagnosis not present

## 2017-02-27 DIAGNOSIS — Z0000 Encounter for general adult medical examination without abnormal findings: Secondary | ICD-10-CM | POA: Diagnosis not present

## 2017-02-27 DIAGNOSIS — Z6827 Body mass index (BMI) 27.0-27.9, adult: Secondary | ICD-10-CM | POA: Diagnosis not present

## 2017-02-27 DIAGNOSIS — E785 Hyperlipidemia, unspecified: Secondary | ICD-10-CM | POA: Diagnosis not present

## 2017-02-27 DIAGNOSIS — Z9181 History of falling: Secondary | ICD-10-CM | POA: Diagnosis not present

## 2017-02-27 DIAGNOSIS — D519 Vitamin B12 deficiency anemia, unspecified: Secondary | ICD-10-CM | POA: Diagnosis not present

## 2017-02-27 DIAGNOSIS — E039 Hypothyroidism, unspecified: Secondary | ICD-10-CM | POA: Diagnosis not present

## 2017-02-27 DIAGNOSIS — Z1389 Encounter for screening for other disorder: Secondary | ICD-10-CM | POA: Diagnosis not present

## 2017-02-27 DIAGNOSIS — Z79899 Other long term (current) drug therapy: Secondary | ICD-10-CM | POA: Diagnosis not present

## 2017-03-09 ENCOUNTER — Other Ambulatory Visit: Payer: Self-pay

## 2017-03-29 DIAGNOSIS — J0100 Acute maxillary sinusitis, unspecified: Secondary | ICD-10-CM | POA: Diagnosis not present

## 2017-05-22 DIAGNOSIS — Z6826 Body mass index (BMI) 26.0-26.9, adult: Secondary | ICD-10-CM | POA: Diagnosis not present

## 2017-05-22 DIAGNOSIS — D510 Vitamin B12 deficiency anemia due to intrinsic factor deficiency: Secondary | ICD-10-CM | POA: Diagnosis not present

## 2017-05-22 DIAGNOSIS — E7800 Pure hypercholesterolemia, unspecified: Secondary | ICD-10-CM | POA: Diagnosis not present

## 2017-05-22 DIAGNOSIS — Z23 Encounter for immunization: Secondary | ICD-10-CM | POA: Diagnosis not present

## 2017-05-22 DIAGNOSIS — K219 Gastro-esophageal reflux disease without esophagitis: Secondary | ICD-10-CM | POA: Diagnosis not present

## 2017-05-28 DIAGNOSIS — H25043 Posterior subcapsular polar age-related cataract, bilateral: Secondary | ICD-10-CM | POA: Diagnosis not present

## 2017-05-28 DIAGNOSIS — H25813 Combined forms of age-related cataract, bilateral: Secondary | ICD-10-CM | POA: Diagnosis not present

## 2017-05-31 DIAGNOSIS — D510 Vitamin B12 deficiency anemia due to intrinsic factor deficiency: Secondary | ICD-10-CM | POA: Diagnosis not present

## 2017-06-08 DIAGNOSIS — D510 Vitamin B12 deficiency anemia due to intrinsic factor deficiency: Secondary | ICD-10-CM | POA: Diagnosis not present

## 2017-06-14 DIAGNOSIS — D510 Vitamin B12 deficiency anemia due to intrinsic factor deficiency: Secondary | ICD-10-CM | POA: Diagnosis not present

## 2017-07-25 DIAGNOSIS — D510 Vitamin B12 deficiency anemia due to intrinsic factor deficiency: Secondary | ICD-10-CM | POA: Diagnosis not present

## 2017-07-27 DIAGNOSIS — G40209 Localization-related (focal) (partial) symptomatic epilepsy and epileptic syndromes with complex partial seizures, not intractable, without status epilepticus: Secondary | ICD-10-CM | POA: Diagnosis not present

## 2017-07-27 DIAGNOSIS — Z79899 Other long term (current) drug therapy: Secondary | ICD-10-CM | POA: Diagnosis not present

## 2017-08-21 DIAGNOSIS — I639 Cerebral infarction, unspecified: Secondary | ICD-10-CM

## 2017-08-21 HISTORY — PX: CATARACT EXTRACTION, BILATERAL: SHX1313

## 2017-08-21 HISTORY — DX: Cerebral infarction, unspecified: I63.9

## 2017-08-24 DIAGNOSIS — H25811 Combined forms of age-related cataract, right eye: Secondary | ICD-10-CM | POA: Diagnosis not present

## 2017-08-24 DIAGNOSIS — Z01818 Encounter for other preprocedural examination: Secondary | ICD-10-CM | POA: Diagnosis not present

## 2017-08-28 DIAGNOSIS — D510 Vitamin B12 deficiency anemia due to intrinsic factor deficiency: Secondary | ICD-10-CM | POA: Diagnosis not present

## 2017-09-18 DIAGNOSIS — H25811 Combined forms of age-related cataract, right eye: Secondary | ICD-10-CM | POA: Diagnosis not present

## 2017-09-18 DIAGNOSIS — Z79899 Other long term (current) drug therapy: Secondary | ICD-10-CM | POA: Diagnosis not present

## 2017-09-18 DIAGNOSIS — E785 Hyperlipidemia, unspecified: Secondary | ICD-10-CM | POA: Diagnosis not present

## 2017-09-18 DIAGNOSIS — H259 Unspecified age-related cataract: Secondary | ICD-10-CM | POA: Diagnosis not present

## 2017-09-18 DIAGNOSIS — H40013 Open angle with borderline findings, low risk, bilateral: Secondary | ICD-10-CM | POA: Diagnosis not present

## 2017-09-18 DIAGNOSIS — D649 Anemia, unspecified: Secondary | ICD-10-CM | POA: Diagnosis not present

## 2017-09-18 DIAGNOSIS — E039 Hypothyroidism, unspecified: Secondary | ICD-10-CM | POA: Diagnosis not present

## 2017-09-18 DIAGNOSIS — N400 Enlarged prostate without lower urinary tract symptoms: Secondary | ICD-10-CM | POA: Diagnosis not present

## 2017-09-18 DIAGNOSIS — K219 Gastro-esophageal reflux disease without esophagitis: Secondary | ICD-10-CM | POA: Diagnosis not present

## 2017-09-18 DIAGNOSIS — G40909 Epilepsy, unspecified, not intractable, without status epilepticus: Secondary | ICD-10-CM | POA: Diagnosis not present

## 2017-10-01 DIAGNOSIS — Z125 Encounter for screening for malignant neoplasm of prostate: Secondary | ICD-10-CM | POA: Diagnosis not present

## 2017-10-01 DIAGNOSIS — D510 Vitamin B12 deficiency anemia due to intrinsic factor deficiency: Secondary | ICD-10-CM | POA: Diagnosis not present

## 2017-10-01 DIAGNOSIS — R972 Elevated prostate specific antigen [PSA]: Secondary | ICD-10-CM | POA: Diagnosis not present

## 2017-10-02 DIAGNOSIS — Z79899 Other long term (current) drug therapy: Secondary | ICD-10-CM | POA: Diagnosis not present

## 2017-10-02 DIAGNOSIS — H25812 Combined forms of age-related cataract, left eye: Secondary | ICD-10-CM | POA: Diagnosis not present

## 2017-10-02 DIAGNOSIS — E039 Hypothyroidism, unspecified: Secondary | ICD-10-CM | POA: Diagnosis not present

## 2017-10-02 DIAGNOSIS — H259 Unspecified age-related cataract: Secondary | ICD-10-CM | POA: Diagnosis not present

## 2017-10-02 DIAGNOSIS — D649 Anemia, unspecified: Secondary | ICD-10-CM | POA: Diagnosis not present

## 2017-10-02 DIAGNOSIS — K219 Gastro-esophageal reflux disease without esophagitis: Secondary | ICD-10-CM | POA: Diagnosis not present

## 2017-10-02 DIAGNOSIS — E785 Hyperlipidemia, unspecified: Secondary | ICD-10-CM | POA: Diagnosis not present

## 2017-10-19 DIAGNOSIS — M9903 Segmental and somatic dysfunction of lumbar region: Secondary | ICD-10-CM | POA: Diagnosis not present

## 2017-10-19 DIAGNOSIS — M9902 Segmental and somatic dysfunction of thoracic region: Secondary | ICD-10-CM | POA: Diagnosis not present

## 2017-10-19 DIAGNOSIS — M9905 Segmental and somatic dysfunction of pelvic region: Secondary | ICD-10-CM | POA: Diagnosis not present

## 2017-10-19 DIAGNOSIS — M9904 Segmental and somatic dysfunction of sacral region: Secondary | ICD-10-CM | POA: Diagnosis not present

## 2017-10-26 DIAGNOSIS — N401 Enlarged prostate with lower urinary tract symptoms: Secondary | ICD-10-CM | POA: Diagnosis not present

## 2017-10-26 DIAGNOSIS — N138 Other obstructive and reflux uropathy: Secondary | ICD-10-CM | POA: Diagnosis not present

## 2017-10-26 DIAGNOSIS — R972 Elevated prostate specific antigen [PSA]: Secondary | ICD-10-CM | POA: Diagnosis not present

## 2017-10-31 DIAGNOSIS — H524 Presbyopia: Secondary | ICD-10-CM | POA: Diagnosis not present

## 2017-10-31 DIAGNOSIS — Z09 Encounter for follow-up examination after completed treatment for conditions other than malignant neoplasm: Secondary | ICD-10-CM | POA: Diagnosis not present

## 2017-11-28 DIAGNOSIS — M9903 Segmental and somatic dysfunction of lumbar region: Secondary | ICD-10-CM | POA: Diagnosis not present

## 2017-11-28 DIAGNOSIS — M9904 Segmental and somatic dysfunction of sacral region: Secondary | ICD-10-CM | POA: Diagnosis not present

## 2017-11-28 DIAGNOSIS — M9902 Segmental and somatic dysfunction of thoracic region: Secondary | ICD-10-CM | POA: Diagnosis not present

## 2017-11-28 DIAGNOSIS — M9905 Segmental and somatic dysfunction of pelvic region: Secondary | ICD-10-CM | POA: Diagnosis not present

## 2017-11-30 DIAGNOSIS — M9905 Segmental and somatic dysfunction of pelvic region: Secondary | ICD-10-CM | POA: Diagnosis not present

## 2017-11-30 DIAGNOSIS — M9902 Segmental and somatic dysfunction of thoracic region: Secondary | ICD-10-CM | POA: Diagnosis not present

## 2017-11-30 DIAGNOSIS — M9904 Segmental and somatic dysfunction of sacral region: Secondary | ICD-10-CM | POA: Diagnosis not present

## 2017-11-30 DIAGNOSIS — M9903 Segmental and somatic dysfunction of lumbar region: Secondary | ICD-10-CM | POA: Diagnosis not present

## 2017-12-04 DIAGNOSIS — M9905 Segmental and somatic dysfunction of pelvic region: Secondary | ICD-10-CM | POA: Diagnosis not present

## 2017-12-04 DIAGNOSIS — M9904 Segmental and somatic dysfunction of sacral region: Secondary | ICD-10-CM | POA: Diagnosis not present

## 2017-12-04 DIAGNOSIS — M9902 Segmental and somatic dysfunction of thoracic region: Secondary | ICD-10-CM | POA: Diagnosis not present

## 2017-12-04 DIAGNOSIS — M9903 Segmental and somatic dysfunction of lumbar region: Secondary | ICD-10-CM | POA: Diagnosis not present

## 2017-12-17 DIAGNOSIS — M9903 Segmental and somatic dysfunction of lumbar region: Secondary | ICD-10-CM | POA: Diagnosis not present

## 2017-12-17 DIAGNOSIS — M9905 Segmental and somatic dysfunction of pelvic region: Secondary | ICD-10-CM | POA: Diagnosis not present

## 2017-12-17 DIAGNOSIS — M9904 Segmental and somatic dysfunction of sacral region: Secondary | ICD-10-CM | POA: Diagnosis not present

## 2017-12-17 DIAGNOSIS — M9902 Segmental and somatic dysfunction of thoracic region: Secondary | ICD-10-CM | POA: Diagnosis not present

## 2017-12-21 DIAGNOSIS — J00 Acute nasopharyngitis [common cold]: Secondary | ICD-10-CM | POA: Diagnosis not present

## 2017-12-21 DIAGNOSIS — Z6826 Body mass index (BMI) 26.0-26.9, adult: Secondary | ICD-10-CM | POA: Diagnosis not present

## 2018-01-07 DIAGNOSIS — H26492 Other secondary cataract, left eye: Secondary | ICD-10-CM | POA: Diagnosis not present

## 2018-01-29 DIAGNOSIS — M9902 Segmental and somatic dysfunction of thoracic region: Secondary | ICD-10-CM | POA: Diagnosis not present

## 2018-01-29 DIAGNOSIS — M9903 Segmental and somatic dysfunction of lumbar region: Secondary | ICD-10-CM | POA: Diagnosis not present

## 2018-01-29 DIAGNOSIS — M9904 Segmental and somatic dysfunction of sacral region: Secondary | ICD-10-CM | POA: Diagnosis not present

## 2018-01-29 DIAGNOSIS — M9905 Segmental and somatic dysfunction of pelvic region: Secondary | ICD-10-CM | POA: Diagnosis not present

## 2018-02-15 DIAGNOSIS — M9902 Segmental and somatic dysfunction of thoracic region: Secondary | ICD-10-CM | POA: Diagnosis not present

## 2018-02-15 DIAGNOSIS — M9905 Segmental and somatic dysfunction of pelvic region: Secondary | ICD-10-CM | POA: Diagnosis not present

## 2018-02-15 DIAGNOSIS — M9904 Segmental and somatic dysfunction of sacral region: Secondary | ICD-10-CM | POA: Diagnosis not present

## 2018-02-15 DIAGNOSIS — M9903 Segmental and somatic dysfunction of lumbar region: Secondary | ICD-10-CM | POA: Diagnosis not present

## 2018-02-20 DIAGNOSIS — D510 Vitamin B12 deficiency anemia due to intrinsic factor deficiency: Secondary | ICD-10-CM | POA: Diagnosis not present

## 2018-03-22 DIAGNOSIS — D510 Vitamin B12 deficiency anemia due to intrinsic factor deficiency: Secondary | ICD-10-CM | POA: Diagnosis not present

## 2018-04-30 DIAGNOSIS — D510 Vitamin B12 deficiency anemia due to intrinsic factor deficiency: Secondary | ICD-10-CM | POA: Diagnosis not present

## 2018-05-07 DIAGNOSIS — E039 Hypothyroidism, unspecified: Secondary | ICD-10-CM | POA: Diagnosis not present

## 2018-05-07 DIAGNOSIS — I6789 Other cerebrovascular disease: Secondary | ICD-10-CM | POA: Diagnosis not present

## 2018-05-07 DIAGNOSIS — H5442A3 Blindness left eye category 3, normal vision right eye: Secondary | ICD-10-CM | POA: Diagnosis not present

## 2018-05-07 DIAGNOSIS — E7800 Pure hypercholesterolemia, unspecified: Secondary | ICD-10-CM | POA: Diagnosis not present

## 2018-05-07 DIAGNOSIS — G40909 Epilepsy, unspecified, not intractable, without status epilepticus: Secondary | ICD-10-CM | POA: Diagnosis not present

## 2018-05-07 DIAGNOSIS — J9690 Respiratory failure, unspecified, unspecified whether with hypoxia or hypercapnia: Secondary | ICD-10-CM | POA: Diagnosis not present

## 2018-05-07 DIAGNOSIS — H5461 Unqualified visual loss, right eye, normal vision left eye: Secondary | ICD-10-CM | POA: Diagnosis not present

## 2018-05-08 ENCOUNTER — Observation Stay (HOSPITAL_BASED_OUTPATIENT_CLINIC_OR_DEPARTMENT_OTHER): Payer: Medicare Other

## 2018-05-08 ENCOUNTER — Encounter (HOSPITAL_COMMUNITY): Payer: Self-pay | Admitting: Internal Medicine

## 2018-05-08 ENCOUNTER — Observation Stay (HOSPITAL_COMMUNITY)
Admission: EM | Admit: 2018-05-08 | Discharge: 2018-05-09 | Disposition: A | Discharge status: Home / Self Care | Payer: Medicare Other | Source: Other Acute Inpatient Hospital | Attending: Internal Medicine | Admitting: Internal Medicine

## 2018-05-08 ENCOUNTER — Observation Stay (HOSPITAL_COMMUNITY): Payer: Medicare Other

## 2018-05-08 DIAGNOSIS — N401 Enlarged prostate with lower urinary tract symptoms: Secondary | ICD-10-CM | POA: Diagnosis not present

## 2018-05-08 DIAGNOSIS — E039 Hypothyroidism, unspecified: Secondary | ICD-10-CM | POA: Diagnosis not present

## 2018-05-08 DIAGNOSIS — I639 Cerebral infarction, unspecified: Secondary | ICD-10-CM

## 2018-05-08 DIAGNOSIS — G40209 Localization-related (focal) (partial) symptomatic epilepsy and epileptic syndromes with complex partial seizures, not intractable, without status epilepticus: Secondary | ICD-10-CM | POA: Diagnosis not present

## 2018-05-08 DIAGNOSIS — G40909 Epilepsy, unspecified, not intractable, without status epilepticus: Secondary | ICD-10-CM | POA: Diagnosis not present

## 2018-05-08 DIAGNOSIS — Z79899 Other long term (current) drug therapy: Secondary | ICD-10-CM | POA: Diagnosis not present

## 2018-05-08 DIAGNOSIS — H53131 Sudden visual loss, right eye: Secondary | ICD-10-CM | POA: Diagnosis not present

## 2018-05-08 DIAGNOSIS — H5461 Unqualified visual loss, right eye, normal vision left eye: Secondary | ICD-10-CM | POA: Diagnosis not present

## 2018-05-08 DIAGNOSIS — E785 Hyperlipidemia, unspecified: Secondary | ICD-10-CM | POA: Diagnosis not present

## 2018-05-08 DIAGNOSIS — Z8673 Personal history of transient ischemic attack (TIA), and cerebral infarction without residual deficits: Secondary | ICD-10-CM | POA: Diagnosis not present

## 2018-05-08 DIAGNOSIS — K219 Gastro-esophageal reflux disease without esophagitis: Secondary | ICD-10-CM | POA: Diagnosis not present

## 2018-05-08 DIAGNOSIS — N138 Other obstructive and reflux uropathy: Secondary | ICD-10-CM | POA: Diagnosis not present

## 2018-05-08 DIAGNOSIS — I340 Nonrheumatic mitral (valve) insufficiency: Secondary | ICD-10-CM | POA: Diagnosis not present

## 2018-05-08 DIAGNOSIS — I6789 Other cerebrovascular disease: Secondary | ICD-10-CM | POA: Diagnosis not present

## 2018-05-08 DIAGNOSIS — G459 Transient cerebral ischemic attack, unspecified: Secondary | ICD-10-CM | POA: Diagnosis not present

## 2018-05-08 DIAGNOSIS — H5442A3 Blindness left eye category 3, normal vision right eye: Secondary | ICD-10-CM | POA: Diagnosis not present

## 2018-05-08 DIAGNOSIS — J9690 Respiratory failure, unspecified, unspecified whether with hypoxia or hypercapnia: Secondary | ICD-10-CM | POA: Diagnosis not present

## 2018-05-08 HISTORY — DX: Benign prostatic hyperplasia without lower urinary tract symptoms: N40.0

## 2018-05-08 HISTORY — DX: Hyperlipidemia, unspecified: E78.5

## 2018-05-08 HISTORY — DX: Gastro-esophageal reflux disease without esophagitis: K21.9

## 2018-05-08 HISTORY — DX: Hypothyroidism, unspecified: E03.9

## 2018-05-08 HISTORY — DX: Sudden visual loss, right eye: H53.131

## 2018-05-08 LAB — CBC WITH DIFFERENTIAL/PLATELET
Abs Immature Granulocytes: 0 10*3/uL (ref 0.0–0.1)
BASOS PCT: 0 %
Basophils Absolute: 0 10*3/uL (ref 0.0–0.1)
EOS PCT: 2 %
Eosinophils Absolute: 0.1 10*3/uL (ref 0.0–0.7)
HEMATOCRIT: 37.1 % — AB (ref 39.0–52.0)
Hemoglobin: 12.2 g/dL — ABNORMAL LOW (ref 13.0–17.0)
Immature Granulocytes: 0 %
Lymphocytes Relative: 25 %
Lymphs Abs: 1.6 10*3/uL (ref 0.7–4.0)
MCH: 31.4 pg (ref 26.0–34.0)
MCHC: 32.9 g/dL (ref 30.0–36.0)
MCV: 95.4 fL (ref 78.0–100.0)
MONO ABS: 0.6 10*3/uL (ref 0.1–1.0)
Monocytes Relative: 10 %
NEUTROS PCT: 63 %
Neutro Abs: 3.9 10*3/uL (ref 1.7–7.7)
PLATELETS: 143 10*3/uL — AB (ref 150–400)
RBC: 3.89 MIL/uL — ABNORMAL LOW (ref 4.22–5.81)
RDW: 12.9 % (ref 11.5–15.5)
WBC: 6.1 10*3/uL (ref 4.0–10.5)

## 2018-05-08 LAB — LIPID PANEL
Cholesterol: 138 mg/dL (ref 0–200)
HDL: 36 mg/dL — ABNORMAL LOW (ref >40)
LDL Cholesterol: 87 mg/dL (ref 0–99)
TRIGLYCERIDES: 76 mg/dL (ref <150)
Total CHOL/HDL Ratio: 3.8 RATIO
VLDL: 15 mg/dL (ref 0–40)

## 2018-05-08 LAB — COMPREHENSIVE METABOLIC PANEL
ALBUMIN: 3.9 g/dL (ref 3.5–5.0)
ALT: 11 U/L (ref 0–44)
AST: 15 U/L (ref 15–41)
Alkaline Phosphatase: 88 U/L (ref 38–126)
Anion gap: 8 (ref 5–15)
BILIRUBIN TOTAL: 0.4 mg/dL (ref 0.3–1.2)
BUN: 15 mg/dL (ref 8–23)
CO2: 23 mmol/L (ref 22–32)
Calcium: 8.9 mg/dL (ref 8.9–10.3)
Chloride: 108 mmol/L (ref 98–111)
Creatinine, Ser: 1.05 mg/dL (ref 0.61–1.24)
GFR calc non Af Amer: >60 mL/min (ref >60)
GLUCOSE: 98 mg/dL (ref 70–99)
Potassium: 4.4 mmol/L (ref 3.5–5.1)
Sodium: 139 mmol/L (ref 135–145)
Total Protein: 6.6 g/dL (ref 6.5–8.1)

## 2018-05-08 LAB — ECHOCARDIOGRAM COMPLETE
HEIGHTINCHES: 71 in
WEIGHTICAEL: 2994.73 [oz_av]

## 2018-05-08 LAB — TSH: TSH: 5.275 u[IU]/mL — ABNORMAL HIGH (ref 0.350–4.500)

## 2018-05-08 LAB — PHENOBARBITAL LEVEL: Phenobarbital: 16.4 ug/mL (ref 15.0–30.0)

## 2018-05-08 LAB — PHENYTOIN LEVEL, TOTAL: Phenytoin Lvl: 10.5 ug/mL (ref 10.0–20.0)

## 2018-05-08 LAB — PROTIME-INR
INR: 0.98
Prothrombin Time: 12.8 seconds (ref 11.4–15.2)

## 2018-05-08 LAB — T4, FREE: Free T4: 0.86 ng/dL (ref 0.82–1.77)

## 2018-05-08 MED ORDER — PHENYTOIN SODIUM EXTENDED 100 MG PO CAPS
200.0000 mg | ORAL_CAPSULE | Freq: Every day | ORAL | Status: DC
  Administered 2018-05-08: 200 mg via ORAL
  Filled 2018-05-08: qty 2

## 2018-05-08 MED ORDER — DUTASTERIDE 0.5 MG PO CAPS
0.5000 mg | ORAL_CAPSULE | Freq: Every day | ORAL | Status: DC
  Administered 2018-05-08 – 2018-05-09 (×2): 0.5 mg via ORAL
  Filled 2018-05-08 (×2): qty 1

## 2018-05-08 MED ORDER — STROKE: EARLY STAGES OF RECOVERY BOOK
Freq: Once | Status: AC
  Administered 2018-05-08: 13:00:00

## 2018-05-08 MED ORDER — ACETAMINOPHEN 650 MG RE SUPP: 650.0000 mg | RECTAL | Status: DC | PRN

## 2018-05-08 MED ORDER — ASPIRIN 325 MG PO TABS
325.0000 mg | ORAL_TABLET | Freq: Every day | ORAL | Status: DC
  Administered 2018-05-08 – 2018-05-09 (×2): 325 mg via ORAL
  Filled 2018-05-08 (×2): qty 1

## 2018-05-08 MED ORDER — FAMOTIDINE 20 MG PO TABS
40.0000 mg | ORAL_TABLET | Freq: Every day | ORAL | Status: DC
  Administered 2018-05-08: 40 mg via ORAL
  Filled 2018-05-08: qty 2

## 2018-05-08 MED ORDER — SENNOSIDES-DOCUSATE SODIUM 8.6-50 MG PO TABS: 1.0000 | ORAL_TABLET | Freq: Every evening | ORAL | Status: DC | PRN

## 2018-05-08 MED ORDER — ACETAMINOPHEN 325 MG PO TABS: 650.0000 mg | ORAL_TABLET | ORAL | Status: DC | PRN

## 2018-05-08 MED ORDER — PHENYTOIN SODIUM EXTENDED 100 MG PO CAPS
100.0000 mg | ORAL_CAPSULE | Freq: Every day | ORAL | Status: DC
  Administered 2018-05-08 – 2018-05-09 (×2): 100 mg via ORAL
  Filled 2018-05-08 (×2): qty 1

## 2018-05-08 MED ORDER — SODIUM CHLORIDE 0.9 % IV SOLN
INTRAVENOUS | Status: DC
  Administered 2018-05-08 – 2018-05-09 (×2): via INTRAVENOUS

## 2018-05-08 MED ORDER — PHENOBARBITAL 30 MG PO TABS: 30.0000 mg | ORAL_TABLET | Freq: Two times a day (BID) | ORAL | Status: DC

## 2018-05-08 MED ORDER — PHENOBARBITAL 32.4 MG PO TABS
64.8000 mg | ORAL_TABLET | Freq: Every day | ORAL | Status: DC
  Administered 2018-05-08: 64.8 mg via ORAL
  Filled 2018-05-08: qty 2

## 2018-05-08 MED ORDER — PHENOBARBITAL 32.4 MG PO TABS
32.4000 mg | ORAL_TABLET | Freq: Every day | ORAL | Status: DC
  Administered 2018-05-08 – 2018-05-09 (×2): 32.4 mg via ORAL
  Filled 2018-05-08 (×2): qty 1

## 2018-05-08 MED ORDER — ENOXAPARIN SODIUM 40 MG/0.4ML ~~LOC~~ SOLN
40.0000 mg | SUBCUTANEOUS | Status: DC
  Administered 2018-05-08 – 2018-05-09 (×2): 40 mg via SUBCUTANEOUS
  Filled 2018-05-08 (×2): qty 0.4

## 2018-05-08 MED ORDER — ROSUVASTATIN CALCIUM 20 MG PO TABS
20.0000 mg | ORAL_TABLET | Freq: Every day | ORAL | Status: DC
  Administered 2018-05-08 – 2018-05-09 (×2): 20 mg via ORAL
  Filled 2018-05-08 (×2): qty 1

## 2018-05-08 MED ORDER — LORAZEPAM 2 MG/ML IJ SOLN
1.0000 mg | Freq: Once | INTRAMUSCULAR | Status: AC
  Administered 2018-05-08: 1 mg via INTRAVENOUS
  Filled 2018-05-08: qty 1

## 2018-05-08 MED ORDER — ACETAMINOPHEN 160 MG/5ML PO SOLN: 650.0000 mg | ORAL | Status: DC | PRN

## 2018-05-08 MED ORDER — LEVOTHYROXINE SODIUM 25 MCG PO TABS
25.0000 ug | ORAL_TABLET | Freq: Every day | ORAL | Status: DC
  Administered 2018-05-08 – 2018-05-09 (×2): 25 ug via ORAL
  Filled 2018-05-08 (×2): qty 1

## 2018-05-08 MED ORDER — PHENYTOIN SODIUM EXTENDED 100 MG PO CAPS: 100.0000 mg | ORAL_CAPSULE | Freq: Two times a day (BID) | ORAL | Status: DC

## 2018-05-08 MED ORDER — ASPIRIN 300 MG RE SUPP: 300.0000 mg | Freq: Every day | RECTAL | Status: DC

## 2018-05-08 NOTE — H&P (Signed)
History and Physical    [name]:[id number] DOB: August 08, 1946 DOA: 05/08/2018  PCP: Angelina Sheriff, MD Consultants:  Teryl Lucy - neurology; [place] Crane - urology; [place] Patient coming from:  Home - lives with wife; Donald Prose: Wife, [phone]  Chief Complaint: Acute transient unilateral vision loss  HPI: Edward Boone is a 72 y.o. male with medical history significant of seizure, BPH, hyperlipidemia, hypothyroidism presenting to [facility] with acute vision loss in the right eye.  He was going to bed last night about 11pm.  As he was getting into bed, he realized that he had no vision in his right eye.  He denies pain or symptoms.  It lasted for 3-4 minutes and then resolved completely.  He has not had recurrence of symptoms.  No other neurologic symptoms.  His last eye exam was April or May of this year.  He had cataract surgery in Jan-Feb of both eyes and this was f/u.     ED Course:  Carryover, per Dr. Blaine Hamper:   Patient had sudden vision loss in the right eye at about 11 PM. Symptoms lasted for about 4 minutes, then resolved spontaneously. No eye pain or other focal neurologic findings. 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 Telemetry neurology was consulted by EDP, who recommended CT angiogram of head and neck, wPatient had sudden vision loss in the right eye at about 11 PM. Symptoms lasted for about 4 minutes, then resolved spontaneously. No eye pain or other focal neurologic findings. 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 Telemetry neurology was consulted by EDP, who recommended CT angiogram of head and neck, which is negative for LVO. Potential differential diagnosis includes TIA/stroke versus and retina disease. Since they do not have ophthalmologist coverage, patient will be transferred to [facility] hospital. I think strategy should be to do MRI to rule out stroke first, if MRI is negative for stroke, will need to get ophthalmologist consultation. Pt is accepted to tele  bed for obs. May need to consult neuro at arrival.   Review of Systems: As per HPI; otherwise review of systems reviewed and negative.   Ambulatory Status:  Ambulates without assistance  Past Medical History:  Diagnosis Date  . BPH (benign prostatic hyperplasia)   . Dyslipidemia   . Epilepsy ([place])   . GERD (gastroesophageal reflux disease)   . Hypothyroidism (acquired)     Past Surgical History:  Procedure Laterality Date  . CATARACT EXTRACTION, BILATERAL  2019  . kidney stone removal      Social History   Socioeconomic History  . Marital status: Married    Spouse name: Not on file  . Number of children: Not on file  . Years of education: Not on file  . Highest education level: Not on file  Occupational History  . Occupation: retired  Scientific laboratory technician  . Financial resource strain: Not on file  . Food insecurity:    Worry: Not on file    Inability: Not on file  . Transportation needs:    Medical: Not on file    Non-medical: Not on file  Tobacco Use  . Smoking status: Never Smoker  . Smokeless tobacco: Never Used  Substance and Sexual Activity  . Alcohol use: No  . Drug use: No  . Sexual activity: Not on file  Lifestyle  . Physical activity:    Days per week: Not on file    Minutes per session: Not on file  . Stress: Not on file  Relationships  . Social connections:    Talks on phone: Not on file    Gets together: Not on file    Attends religious service: Not on file    Active member of club or organization: Not on file    Attends meetings of clubs or organizations: Not on file    Relationship status: Not on file  . Intimate partner violence:    Fear of current or ex partner: Not on file    Emotionally abused: Not on file    Physically abused: Not on file    Forced sexual activity: Not on file  Other Topics Concern  . Not on file  Social History Narrative  . Not on file    No Known Allergies  Family History  Problem Relation Age of Onset  . CVA  Father 70  . Atrial fibrillation Father     Prior to Admission medications   Medication Sig Start Date End Date Taking? Authorizing Provider  dutasteride (AVODART) 0.5 MG capsule  05/24/16   [provider]  finasteride (PROSCAR) 5 MG tablet  05/21/14   [provider]  Influenza Vac Split High-Dose 0.5 ML SUSY  05/21/14   [provider]  levothyroxine (SYNTHROID, LEVOTHROID) 25 MCG tablet Frequency:daily   Dosage:25   MCG  Instructions:Levothyroxine Sodium (25MCG TABS, 1 Oral daily)  Note:    [provider]  PHENObarbital (LUMINAL) 30 MG tablet Take 30 mg by mouth. 04/14/16   [provider]  phenytoin (DILANTIN) 100 MG ER capsule Take 300 mg by mouth. 07/22/15 07/21/16  [provider]    Physical Exam: Vitals:   05/08/18 0555  BP: (!) 147/80  Pulse: 78  Resp: 18  Temp: 97.7 F (36.5 C)  TempSrc: Oral  SpO2: 97%  Weight: 84.9 kg  Height: 5\' 11"  (1.803 m)     General:  Appears calm and comfortable and is NAD Eyes:  PERRL, EOMI, normal lids, iris ENT:  grossly normal hearing, lips & tongue, mmm; appropriate dentition Neck:  no LAD, masses or thyromegaly; no carotid bruits (possibly very subtle bruit on the right) Cardiovascular:  RRR, no m/r/g. No LE edema.  Respiratory:   CTA bilaterally with no wheezes/rales/rhonchi.  Normal respiratory effort. Abdomen:  soft, NT, ND, NABS Back:   normal alignment, no CVAT Skin:  no rash or induration seen on limited exam Musculoskeletal:  grossly normal tone BUE/BLE, good ROM, no bony abnormality Lower extremity:  No LE edema.  2+ distal pulses. Psychiatric:  grossly normal mood and affect, speech fluent and appropriate, AOx3 Neurologic:  CN 2-12 grossly intact, moves all extremities in coordinated fashion, sensation intact    Radiological Exams on Admission: No results found.  EKG: Independently reviewed.  NSR with rate 77; no evidence of acute ischemia   Labs on Admission: I  have personally reviewed the available labs and imaging studies at the time of the admission.  Pertinent labs:   Normal CMP WBC 6.1 Hgb 12.2 Lipids: 138/36/87/76 Phenobarb 16.4 Phenytoin 10.5   Assessment/Plan Principal Problem:   Acute loss of vision, right Active Problems:   Partial epilepsy with impairment of consciousness (HCC)   Hypothyroidism (acquired)   Dyslipidemia   Acute transient right-sided vision loss -Patient had an episode last night of about 3 minutes of acute right-sided vision loss, which resolved spontaneously -He had no other neurologic deficits -Somewhat concerning for TIA/CVA -Will place in observation status for CVA/TIA evaluation -Telemetry monitoring -MRI/MRA -If MRI is negative, will consult  ophthalmology for either inpatient or outpatient evaluation -If MRI is positive, he will need carotid dopplers, Echo, and neuro evaluation -Risk stratification with FLP; will also check TSH  -ASA daily -Will not order PT/OT/ST Consults at this time given the very transient nature of his symptoms with complete resolution -Nutrition consult ordered  HTN -Allow permissive HTN for now -Treat BP only if >220/120, and then with goal of 15% reduction -He does not have known h/o HTN   HLD -FLP showed generally good control, but LDL is >70 and so if this is thought to be TIA he will need increase in medicaiton -Resume statin but he is uncertain of Crestor dose (pharmacy has not yet been by to see the patient); will start 20 mg daily for now   Epilepsy -Per last neurology note, he is many years post-seizures and there was consideration of tapering his medications but the patient "refuses to consider going off of either of them." -His levels are therapeutic at the low range for both medications. -Continue meds without change.  Hypothyroidism -Check TSH/free T4 -Continue Synthroid at current dose for now  BPH -Continue Avodart  GERD -Continue Zantac  (formulary substitution to Pepcid)   DVT prophylaxis:  Lovenox  Code Status: DNR - confirmed with patient Family Communication: None present Disposition Plan:  Home once clinically improved Consults called:  Nutrition; Will need either neurology or ophthalmology pending results of MRI  Admission status: It is my clinical opinion that referral for OBSERVATION is reasonable and necessary in this patient based on the above information provided. The aforementioned taken together are felt to place the patient at high risk for further clinical deterioration. However it is anticipated that the patient may be medically stable for discharge from the hospital within 24 to 48 hours.   Karmen Bongo MD [facility]  If note is complete, please contact covering daytime or nighttime physician. [email or web address] Password [facility]  05/08/2018, 8:23 AM

## 2018-05-08 NOTE — Progress Notes (Signed)
Patient brought in by EMS from [facility] for evaluation of his rt eye vision loss that happened yesterday at 2300 when he was ready for bed.on arrival pt A/Ox 4, denies any vision loss,  N/V,  numbness, tingling or any pain.  Made comfortable in bed and vitals initiated. Admissions notified.

## 2018-05-08 NOTE — Progress Notes (Addendum)
Nutrition Brief Note  RD consulted for TIA nutrition assessment.  Wt Readings from Last 15 Encounters:  05/08/18 84.9 kg  01/20/14 83.9 kg    Body mass index is 26.11 kg/m. Patient meets criteria for overweight based on current BMI.   Current diet order is heart healthy, patient is consuming approximately 95% of meals at this time. Pt reports having a good appetite currently and PTA with no other difficulties. Labs and medications reviewed. Discussed pt's usual diet recall and provided examples on ways to decrease sodium and fat intake in diet. Discouraged intake of processed foods and use of salt shaker. Encouraged fresh fruits and vegetables. No further nutrition interventions warranted at this time. If nutrition issues arise, please consult RD.   Edward Parker, MS, RD, LDN Pager # [phone] After hours/ weekend pager # [phone]

## 2018-05-08 NOTE — Progress Notes (Signed)
I spoke with neurology about this patient based on his MRI/A results. His presentation is concerning for central retinal artery occlusion.  Needs carotid dopplers and Echo as well as telemetry looking for afib.  He has a h/o old infarct by MRI.  He likely needs a 30-day loop recorder.  Neurology will consult.  Ophthalmology was also called and callback is pending.  Carlyon Shadow, M.D.

## 2018-05-08 NOTE — Consult Note (Addendum)
Neurology Consultation  Reason for Consult: Transient right eye visual loss Referring Physician: Lorin Mercy  CC: Visual loss  History is obtained from:   HPI: Edward Boone is a 72 y.o. male who has dyslipidemia as his only stroke risk factor.  He is very compliant with his medications.  Other past medical history from a neurological standpoint is epilepsy.  He states that he takes his medication religiously and follows up with his neurologist frequently.  Patient states that at approximately 11:00 last night he had gotten into bed and approximately 1105 he noted a 4-minute period in which she had lost vision in his right eye.  He describes the vision as a curtain that went from the right to the left and then when resolved point from the left to the right.  There was no pain associated with this and he stated that he had absolutely no vision out of that eye and did cover both eyes to make sure it was only in that eye.  He states he had no other symptoms such as dysarthria, a aphasia, numbness, tingling, weakness.  Currently patient is sitting up in bed with no acute problems.   ED course: This patient was a transfer from [facility].  While at the ED patient had a CT of the head which showed an old left parietal infarct.  Telemetry showed no abnormal rhythm.  CT Angie of head and neck were negative.  Due to the possibility that this could be a TIA/stroke or retinal occlusion he was transferred to [facility].   Chart review patient has not been seen at [facility] prior to this problem.  He does see a neurologist at [facility] but this is purely for his epilepsy.   LKW: 2300 hrs. on 05/07/2018 tpa given?: no, symptoms fully resolved Premorbid modified Rankin scale (mRS): 0 NIH stroke scale 0    ROS: A 14 point ROS was performed and is negative except as noted in the HPI.  Past Medical History:  Diagnosis Date  . BPH (benign prostatic hyperplasia)   . Dyslipidemia   . Epilepsy ([place])   . GERD  (gastroesophageal reflux disease)   . Hypothyroidism (acquired)      Family History  Problem Relation Age of Onset  . CVA Father 71  . Atrial fibrillation Father     Social History:   reports that he has never smoked. He has never used smokeless tobacco. He reports that he does not drink alcohol or use drugs.  Medications  Current Facility-Administered Medications:  .  0.9 %  sodium chloride infusion, , Intravenous, Continuous, Karmen Bongo, MD, Last Rate: 50 mL/hr at 05/08/18 1126 .  acetaminophen (TYLENOL) tablet 650 mg, 650 mg, Oral, Q4H PRN **OR** acetaminophen (TYLENOL) solution 650 mg, 650 mg, Per Tube, Q4H PRN **OR** acetaminophen (TYLENOL) suppository 650 mg, 650 mg, Rectal, Q4H PRN, Karmen Bongo, MD .  aspirin suppository 300 mg, 300 mg, Rectal, Daily **OR** aspirin tablet 325 mg, 325 mg, Oral, Daily, Karmen Bongo, MD, 325 mg at 05/08/18 0935 .  dutasteride (AVODART) capsule 0.5 mg, 0.5 mg, Oral, Daily, Karmen Bongo, MD, 0.5 mg at 05/08/18 1321 .  enoxaparin (LOVENOX) injection 40 mg, 40 mg, Subcutaneous, Q24H, Karmen Bongo, MD, 40 mg at 05/08/18 1320 .  famotidine (PEPCID) tablet 40 mg, 40 mg, Oral, QHS, Karmen Bongo, MD .  levothyroxine (SYNTHROID, LEVOTHROID) tablet 25 mcg, 25 mcg, Oral, QAC breakfast, Karmen Bongo, MD, 25 mcg at 05/08/18 0935 .  PHENobarbital (LUMINAL) tablet 32.4 mg,  32.4 mg, Oral, Daily, 32.4 mg at 05/08/18 0935 **AND** PHENobarbital (LUMINAL) tablet 64.8 mg, 64.8 mg, Oral, QHS, Karmen Bongo, MD .  phenytoin (DILANTIN) ER capsule 100 mg, 100 mg, Oral, Daily, 100 mg at 05/08/18 0935 **AND** phenytoin (DILANTIN) ER capsule 200 mg, 200 mg, Oral, QHS, Karmen Bongo, MD .  rosuvastatin (CRESTOR) tablet 20 mg, 20 mg, Oral, Daily, Karmen Bongo, MD, 20 mg at 05/08/18 0935 .  senna-docusate (Senokot-S) tablet 1 tablet, 1 tablet, Oral, QHS PRN, Karmen Bongo, MD   Exam: Current vital signs: BP 120/82 (BP Location: Left Arm)   Pulse  83   Temp 97.7 F (36.5 C) (Oral)   Resp 18   Ht 5\' 11"  (1.803 m)   Wt 84.9 kg   SpO2 97%   BMI 26.11 kg/m  Vital signs in last 24 hours: Temp:  [97.7 F (36.5 C)] 97.7 F (36.5 C) (09/18 0555) Pulse Rate:  [78-83] 83 (09/18 1312) Resp:  [18] 18 (09/18 0555) BP: (120-147)/(80-82) 120/82 (09/18 1312) SpO2:  [97 %] 97 % (09/18 0555) Weight:  [84.9 kg] 84.9 kg (09/18 0555)  GENERAL: Awake, alert in NAD HEENT: - Normocephalic and atraumatic, dry mm, no LN++, no Thyromegally Ext: warm, well perfused, intact peripheral pulses  NEURO:  Mental Status: AA&Ox3, speech is clear.  Naming, repetition, fluency, and comprehension intact. Cranial Nerves: PERRL 2 mm/brisk. EOMI, visual fields full, no facial asymmetry, facial sensation intact, hearing intact, tongue/uvula/soft palate midline, normal Motor: 5/5 throughout Tone: is normal and bulk is normal Sensation- Intact to light touch bilaterally Coordination: FTN intact bilaterally, no ataxia in BLE. Gait- defered  Labs I have reviewed labs in epic and the results pertinent to this consultation are:   CBC    Component Value Date/Time   WBC 6.1 05/08/2018 0621   RBC 3.89 (L) 05/08/2018 0621   HGB 12.2 (L) 05/08/2018 0621   HCT 37.1 (L) 05/08/2018 0621   PLT 143 (L) 05/08/2018 0621   MCV 95.4 05/08/2018 0621   MCH 31.4 05/08/2018 0621   MCHC 32.9 05/08/2018 0621   RDW 12.9 05/08/2018 0621   LYMPHSABS 1.6 05/08/2018 0621   MONOABS 0.6 05/08/2018 0621   EOSABS 0.1 05/08/2018 0621   BASOSABS 0.0 05/08/2018 0621    CMP     Component Value Date/Time   NA 139 05/08/2018 0621   K 4.4 05/08/2018 0621   CL 108 05/08/2018 0621   CO2 23 05/08/2018 0621   GLUCOSE 98 05/08/2018 0621   BUN 15 05/08/2018 0621   CREATININE 1.05 05/08/2018 0621   CALCIUM 8.9 05/08/2018 0621   PROT 6.6 05/08/2018 0621   ALBUMIN 3.9 05/08/2018 0621   AST 15 05/08/2018 0621   ALT 11 05/08/2018 0621   ALKPHOS 88 05/08/2018 0621   BILITOT 0.4  05/08/2018 0621   GFRNONAA >60 05/08/2018 0621   GFRAA >60 05/08/2018 0621    Lipid Panel     Component Value Date/Time   CHOL 138 05/08/2018 0621   TRIG 76 05/08/2018 0621   HDL 36 (L) 05/08/2018 0621   CHOLHDL 3.8 05/08/2018 0621   VLDL 15 05/08/2018 0621   LDLCALC 87 05/08/2018 0621     Imaging I have reviewed the images obtained:  MRA of head--chronic narrowing or occlusion of the anterior posterior left M2 branches consistent with remote infarct, mild medium and small vessel disease without other significant proximal stenosis, aneurysm, or occlusion within the circle of Willis  MRI examination of the brain--no acute infarct, remote left frontal  and parietal infarcts that are stable.  Etta Quill PA-C [facility] [phone]  M-F  (9:00 am- 5:00 PM)  05/08/2018, 3:09 PM        Assessment: 72 year old male with stroke risk factors of hyperlipidemia which I believe suffered transient four-minute episode of amourosis fugax.     Impression: Amourisis fugax of the right eye   Recommend #Transthoracic Echo,  # Carotid Duplex to r/o carotid stenosis # Start patient on ASA 325mg  daily,  #Start or continue Atorvastatin 40 mg/other high intensity statin # HBAIC and Lipid profile # Telemetry monitoring # Frequent neuro checks # NPO until passes stroke swallow screen # please page stroke NP  Or  PA  Or MD from 8am -4 pm  as this patient from this time will be  followed by the stroke.   You can look them up on [email or web address]  Password [email or web address]   NEUROHOSPITALIST ADDENDUM Performed a face to face diagnostic evaluation.   I have reviewed the contents of history and physical exam as documented by PA/ARNP/Resident and agree with above documentation.  I have discussed and formulated the  plan as documented. Edits to the note have been made as needed  Pt presented with transient loss of vision, painless for 3 minutes. Included entire right vision.  No other  symptoms. 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  Vas [place] negative for significant carotid stenosis. Echo negative for thrombus, dilated left atrium. EF normal.  Would recommend ASA 325 mg daily and 30 day monitor for pAF given old cortical infarct.  F/U Neurology in 3-4 weeks   Stefhanie Kachmar MD [facility] [phone]   If 7pm to 7am, please call on call as listed on AMION.

## 2018-05-08 NOTE — Progress Notes (Signed)
Carotid duplex prelim: no significant ICA stenosis.  Diana Armijo Eunice, RDMS, RVT   

## 2018-05-08 NOTE — Progress Notes (Signed)
  Echocardiogram 2D Echocardiogram has been performed.  Edward Boone 05/08/2018, 3:29 PM

## 2018-05-08 NOTE — Care Management (Signed)
  This is a no charge note  Transfer from [place] per Dr. Odie Sera  72 year old male with a past medical history of seizure, BPH, hyperlipidemia, hypothyroidism, who presents with vision loss in right eye.  Patient had sudden vision loss in the right eye at about 11 PM.  Symptoms lasted for about 4 minutes, then resolved spontaneously.  No eye pain or other focal neurologic findings.  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  Telemetry neurology was consulted by EDP, who recommended CT angiogram of head and neck, which is negative for LVO.  Potential differential diagnosis includes TIA/stroke versus and retina disease. Since they do not have ophthalmologist coverage, patient will be transferred to [facility] hospital.  I think strategy should be to do MRI to rule out stroke first, if MRI is negative for stroke, will need to get ophthalmologist consultation. Pt is accepted to tele bed for obs. May need to consult neuro at arrival.   Please call manager of [facility] at [phone] when pt arrives to floor   Ivor Costa, MD  [facility] Pager [phone]  If 7PM-7AM, please contact night-coverage [email or web address] Password [facility] 05/08/2018, 3:34 AM

## 2018-05-09 ENCOUNTER — Encounter: Payer: Self-pay | Admitting: Physician Assistant

## 2018-05-09 ENCOUNTER — Other Ambulatory Visit: Payer: Self-pay | Admitting: Physician Assistant

## 2018-05-09 ENCOUNTER — Other Ambulatory Visit: Payer: Self-pay

## 2018-05-09 DIAGNOSIS — H53131 Sudden visual loss, right eye: Secondary | ICD-10-CM | POA: Diagnosis not present

## 2018-05-09 DIAGNOSIS — E785 Hyperlipidemia, unspecified: Secondary | ICD-10-CM

## 2018-05-09 DIAGNOSIS — I639 Cerebral infarction, unspecified: Secondary | ICD-10-CM | POA: Diagnosis not present

## 2018-05-09 DIAGNOSIS — G40909 Epilepsy, unspecified, not intractable, without status epilepticus: Secondary | ICD-10-CM | POA: Diagnosis not present

## 2018-05-09 DIAGNOSIS — G40209 Localization-related (focal) (partial) symptomatic epilepsy and epileptic syndromes with complex partial seizures, not intractable, without status epilepticus: Secondary | ICD-10-CM | POA: Diagnosis not present

## 2018-05-09 DIAGNOSIS — E039 Hypothyroidism, unspecified: Secondary | ICD-10-CM | POA: Diagnosis not present

## 2018-05-09 DIAGNOSIS — K219 Gastro-esophageal reflux disease without esophagitis: Secondary | ICD-10-CM

## 2018-05-09 DIAGNOSIS — G459 Transient cerebral ischemic attack, unspecified: Secondary | ICD-10-CM | POA: Diagnosis not present

## 2018-05-09 DIAGNOSIS — N138 Other obstructive and reflux uropathy: Secondary | ICD-10-CM

## 2018-05-09 DIAGNOSIS — N401 Enlarged prostate with lower urinary tract symptoms: Secondary | ICD-10-CM | POA: Diagnosis not present

## 2018-05-09 LAB — HEMOGLOBIN A1C
Hgb A1c MFr Bld: 5.3 % (ref 4.8–5.6)
MEAN PLASMA GLUCOSE: 105 mg/dL

## 2018-05-09 MED ORDER — ASPIRIN 325 MG PO TABS: 325.0000 mg | ORAL_TABLET | Freq: Every day | ORAL | 1 refills | Status: DC

## 2018-05-09 NOTE — Care Management Obs Status (Signed)
MEDICARE OBSERVATION STATUS NOTIFICATION   Patient Details  Name: Edward Boone MRN: [id number] Date of Birth: 07-28-1946   Medicare Observation Status Notification Given:  Yes    Pollie Friar, RN 05/09/2018, 11:08 AM

## 2018-05-09 NOTE — Care Management Note (Signed)
Case Management Note  Patient Details  Name: Edward Boone MRN: [id number] Date of Birth: 05/29/1946  Subjective/Objective:     Pt in with acute vision loss. He is from home with spouse.  PCP: Dr Lin Landsman               Action/Plan: Pt discharging home with self care. Pt has transportation home.   Expected Discharge Date:  05/09/18               Expected Discharge Plan:  Home/Self Care  In-House Referral:     Discharge planning Services     Post Acute Care Choice:    Choice offered to:     DME Arranged:    DME Agency:     HH Arranged:    HH Agency:     Status of Service:  Completed, signed off  If discussed at [employer] of Stay Meetings, dates discussed:    Additional Comments:  Pollie Friar, RN 05/09/2018, 3:29 PM

## 2018-05-09 NOTE — Discharge Summary (Signed)
Physician Discharge Summary  HOYLE BARKDULL FFM:[id number] DOB: Jun 16, 1946 DOA: 05/08/2018  PCP: Angelina Sheriff, MD  Admit date: 05/08/2018 Discharge date: 05/09/2018  Time spent: 35 minutes  Recommendations for Outpatient Follow-up:  1. Reassess BP and if needed start antihypertensive regimen. 2. Make sure patient has follow up with cardiology for 30 days event monitoring as instructed. 3. Patient is supposed to follow up with neurology service in 4 weeks.   Discharge Diagnoses:  Principal Problem:   Acute loss of vision, right Active Problems:   Benign prostatic hyperplasia with urinary obstruction   Partial epilepsy with impairment of consciousness ([place])   Hypothyroidism (acquired)   Dyslipidemia   GERD (gastroesophageal reflux disease)   Ischemic stroke [facility])   Discharge Condition: stable and improved. Discharge home with instructions to follow up with PCP in 10 days.  Diet recommendation: heart healthy diet   Filed Weights   05/08/18 0555  Weight: 84.9 kg    History of present illness:  As per H&P written by Dr. Lorin Mercy on 05/08/18 72 y.o. male with medical history significant of seizure, BPH, hyperlipidemia, hypothyroidism presenting to [facility] with acute vision loss in the right eye.  He was going to bed last night about 11pm.  As he was getting into bed, he realized that he had no vision in his right eye.  He denies pain or symptoms.  It lasted for 3-4 minutes and then resolved completely.  He has not had recurrence of symptoms.  No other neurologic symptoms.  His last eye exam was April or May of this year.  He had cataract surgery in Jan-Feb of both eyes and this was f/u.    Hospital Course:  1-TIA: acute transient right sided vision loss/old ischemic strokes seen on MRI -resolved. -TIA/stroke work up negative for acute abnormalities. -old ischemic infarcts appreciated on MRI -following neurology rec's will start full dose aspirin for secondary  prevention -continue risk factors modifications.  -outpatient 30 days event monitoring  -continue statins and outpatient follow up for BP, with initiation on anti  2-HTN -fair control -was not on any meds prior to admission. -encouraged to follow heart healthy diet  3-Dyslipidemia -continue statins  4-epilepsy -continue home antiepileptic drugs -no new seizures recently. -appears to be well controlled.   5-BPH -no symptoms of urinary retention -continue avodart   6-hypothyroidism -continue synthroid   7-GERD -continue Zantac.   Procedures:  See below for x-ray reports   2-D echo: - Left ventricle: The cavity size was normal. Wall thickness was   normal. Systolic function was normal. The estimated ejection   fraction was in the range of 60% to 65%. Wall motion was normal;   there were no regional wall motion abnormalities. Features are   consistent with a pseudonormal left ventricular filling pattern,   with concomitant abnormal relaxation and increased filling   pressure (grade 2 diastolic dysfunction). - Mitral valve: There was mild regurgitation.  Consultations:  Neurology   Discharge Exam: Vitals:   05/09/18 0807 05/09/18 1119  BP: 138/69 (!) 142/83  Pulse: 88 78  Resp: 20 16  Temp: 98.8 F (37.1 C) 98.1 F (36.7 C)  SpO2: 95% 99%    General: afebrile, no CP, no SOB and denies any further neurologic deficit or complaints.  Cardiovascular: S1 and S2, no rubs, no gallops, no murmur. Patient with no acute abnormalities on telemetry. Respiratory: CTA bilaterally Abd: soft, NT, ND, positive BS Extremities: no edema, no cyanosis, no clubbing.  Neurologic exam:  CN intact, no focal deficit appreciated. MS 5/5 bilaterally. Patient AAOX3.  Discharge Instructions   Discharge Instructions    Discharge instructions   Complete by:  As directed    Take medications as prescribed Follow heart healthy diet  Arrange follow up with PCP in 10 days Follow  instructions for outpatient event monitoring.   Increase activity slowly   Complete by:  As directed      Allergies as of 05/09/2018   No Known Allergies     Medication List    TAKE these medications   aspirin 325 MG tablet Take 1 tablet (325 mg total) by mouth daily. Start taking on:  05/10/2018   dutasteride 0.5 MG capsule Commonly known as:  AVODART   levothyroxine 25 MCG tablet Commonly known as:  SYNTHROID, LEVOTHROID Take 25 mcg by mouth daily before breakfast.   PHENObarbital 30 MG tablet Commonly known as:  LUMINAL Take 30-60 mg by mouth 2 (two) times daily. 30 mg qAM, 60 mg qhs   phenytoin 100 MG ER capsule Commonly known as:  DILANTIN Take 100-200 mg by mouth 2 (two) times daily. 100 mg qAM, 200 mg qhs   ranitidine 300 MG capsule Commonly known as:  ZANTAC Take 300 mg by mouth every evening.   rosuvastatin 5 MG tablet Commonly known as:  CRESTOR Take 5 mg by mouth daily.      No Known Allergies Follow-up Information    [facility].   Specialty:  Cardiology Why:  Office will call you to arrange 30-day heart monitor. Contact information: [address] [place] [phone]       Angelina Sheriff, MD. Schedule an appointment as soon as possible for a visit in 10 day(s).   Specialty:  Family Medicine Contact information: [place] [address] [phone]           The results of significant diagnostics from this hospitalization (including imaging, microbiology, ancillary and laboratory) are listed below for reference.    Significant Diagnostic Studies: Mr Brain Wo Contrast  Result Date: 05/08/2018 CLINICAL DATA:  TIA, initial exam. Transient episode of right-sided vision loss lasting approximately 3 minutes. Symptoms have resolved. EXAM: MRI HEAD WITHOUT CONTRAST MRA HEAD WITHOUT CONTRAST TECHNIQUE: Multiplanar, multiecho pulse sequences of the brain  and surrounding structures were obtained without intravenous contrast. Angiographic images of the head were obtained using MRA technique without contrast. COMPARISON:  CT head without contrast 05/08/2018. FINDINGS: MRI HEAD FINDINGS Brain: No acute infarct, hemorrhage, or mass lesion is present. The ventricles are of normal size. A remote left frontal and parietal lobe infarct is again noted. Associated ex vacuo dilation is present with the remote infarct. Mild white matter changes are within normal limits for age. No acute hemorrhage or mass lesion is present. There is no significant extra-axial fluid collection. The internal auditory canals are within normal limits. The brainstem is within normal limits. A remote lacunar infarct is present within the left cerebellum. Cerebellum is otherwise normal. Vascular: Flow is present in the major intracranial arteries. Skull and upper cervical spine: The skull base is within normal limits. The craniocervical junction is normal. Upper cervical spine demonstrates degenerative change at C3-4. Sinuses/Orbits: A large polyp or mucous retention cyst is present within the inferior left maxillary sinus. The paranasal sinuses and mastoids right mastoid air cells are otherwise clear. There is fluid in left mastoid air cells. No obstructing nasopharyngeal lesion is present.  Other: None MRA HEAD FINDINGS Time-of-flight images demonstrate atherosclerotic changes in the cavernous internal carotid arteries right greater than left. There is no focal stenosis relative to the more distal vessels. There is a very early bifurcation of the right MCA, a normal variant. Mild medium and small vessel irregularity is present without a significant proximal stenosis or occlusion. The inferior left MCA division is markedly attenuated, consistent with the area remote infarction. ACA and MCA branch vessels are otherwise within normal limits. The left PICA origin is visualized and normal. Vertebral  arteries are within normal limits bilaterally. The basilar artery is normal. Both posterior cerebral arteries originate from basilar tip. There is mild narrowing of the mid right P2 segment. IMPRESSION: 1. No acute infarct. 2. Remote left frontal and parietal infarct is stable. 3. Chronic narrowing or occlusion of the inferior posterior left M2 branches consistent with the remote infarct. 4. Mild medium and small vessel disease without other significant proximal stenosis, aneurysm, or occlusion within the circle-of-Willis. 5. Left mastoid effusion. No obstructing nasopharyngeal lesion is present. 6. Degenerative changes in the upper cervical spine. Electronically Signed   By: San Morelle M.D.   On: 05/08/2018 11:17   Mr Jodene Nam Head Wo Contrast  Result Date: 05/08/2018 CLINICAL DATA:  TIA, initial exam. Transient episode of right-sided vision loss lasting approximately 3 minutes. Symptoms have resolved. EXAM: MRI HEAD WITHOUT CONTRAST MRA HEAD WITHOUT CONTRAST TECHNIQUE: Multiplanar, multiecho pulse sequences of the brain and surrounding structures were obtained without intravenous contrast. Angiographic images of the head were obtained using MRA technique without contrast. COMPARISON:  CT head without contrast 05/08/2018. FINDINGS: MRI HEAD FINDINGS Brain: No acute infarct, hemorrhage, or mass lesion is present. The ventricles are of normal size. A remote left frontal and parietal lobe infarct is again noted. Associated ex vacuo dilation is present with the remote infarct. Mild white matter changes are within normal limits for age. No acute hemorrhage or mass lesion is present. There is no significant extra-axial fluid collection. The internal auditory canals are within normal limits. The brainstem is within normal limits. A remote lacunar infarct is present within the left cerebellum. Cerebellum is otherwise normal. Vascular: Flow is present in the major intracranial arteries. Skull and upper cervical  spine: The skull base is within normal limits. The craniocervical junction is normal. Upper cervical spine demonstrates degenerative change at C3-4. Sinuses/Orbits: A large polyp or mucous retention cyst is present within the inferior left maxillary sinus. The paranasal sinuses and mastoids right mastoid air cells are otherwise clear. There is fluid in left mastoid air cells. No obstructing nasopharyngeal lesion is present. Other: None MRA HEAD FINDINGS Time-of-flight images demonstrate atherosclerotic changes in the cavernous internal carotid arteries right greater than left. There is no focal stenosis relative to the more distal vessels. There is a very early bifurcation of the right MCA, a normal variant. Mild medium and small vessel irregularity is present without a significant proximal stenosis or occlusion. The inferior left MCA division is markedly attenuated, consistent with the area remote infarction. ACA and MCA branch vessels are otherwise within normal limits. The left PICA origin is visualized and normal. Vertebral arteries are within normal limits bilaterally. The basilar artery is normal. Both posterior cerebral arteries originate from basilar tip. There is mild narrowing of the mid right P2 segment. IMPRESSION: 1. No acute infarct. 2. Remote left frontal and parietal infarct is stable. 3. Chronic narrowing or occlusion of the inferior posterior left M2 branches consistent with the remote infarct.  4. Mild medium and small vessel disease without other significant proximal stenosis, aneurysm, or occlusion within the circle-of-Willis. 5. Left mastoid effusion. No obstructing nasopharyngeal lesion is present. 6. Degenerative changes in the upper cervical spine. Electronically Signed   By: San Morelle M.D.   On: 05/08/2018 11:17   Labs: Basic Metabolic Panel: Recent Labs  Lab 05/08/18 0621  NA 139  K 4.4  CL 108  CO2 23  GLUCOSE 98  BUN 15  CREATININE 1.05  CALCIUM 8.9   Liver  Function Tests: Recent Labs  Lab 05/08/18 0621  AST 15  ALT 11  ALKPHOS 88  BILITOT 0.4  PROT 6.6  ALBUMIN 3.9   CBC: Recent Labs  Lab 05/08/18 0621  WBC 6.1  NEUTROABS 3.9  HGB 12.2*  HCT 37.1*  MCV 95.4  PLT 143*    Signed:  Barton Dubois MD.  [facility] 05/09/2018, 2:35 PM

## 2018-05-09 NOTE — Progress Notes (Signed)
Rec'd request from Dr. Dyann Kief for 30 day monitor. Sent message to schedulers to arrange and call patient with pickup information. Mattthew Ziomek PA-C

## 2018-05-09 NOTE — Progress Notes (Signed)
c 

## 2018-05-10 NOTE — Progress Notes (Signed)
Patient given discharge teaching and summary, able to teach back. No new questions or concerns. IV and tele removed. Discharged from unit in wheelchair by staff. Family to transport home

## 2018-05-15 DIAGNOSIS — Z8669 Personal history of other diseases of the nervous system and sense organs: Secondary | ICD-10-CM | POA: Diagnosis not present

## 2018-05-15 DIAGNOSIS — Z9181 History of falling: Secondary | ICD-10-CM | POA: Diagnosis not present

## 2018-05-15 DIAGNOSIS — Z1331 Encounter for screening for depression: Secondary | ICD-10-CM | POA: Diagnosis not present

## 2018-05-15 DIAGNOSIS — Z1339 Encounter for screening examination for other mental health and behavioral disorders: Secondary | ICD-10-CM | POA: Diagnosis not present

## 2018-05-15 DIAGNOSIS — G459 Transient cerebral ischemic attack, unspecified: Secondary | ICD-10-CM | POA: Diagnosis not present

## 2018-05-16 DIAGNOSIS — I639 Cerebral infarction, unspecified: Secondary | ICD-10-CM | POA: Diagnosis not present

## 2018-05-16 DIAGNOSIS — G459 Transient cerebral ischemic attack, unspecified: Secondary | ICD-10-CM | POA: Diagnosis not present

## 2018-05-16 DIAGNOSIS — G40209 Localization-related (focal) (partial) symptomatic epilepsy and epileptic syndromes with complex partial seizures, not intractable, without status epilepticus: Secondary | ICD-10-CM | POA: Diagnosis not present

## 2018-05-22 ENCOUNTER — Encounter: Payer: Self-pay | Admitting: Physician Assistant

## 2018-05-24 DIAGNOSIS — I639 Cerebral infarction, unspecified: Secondary | ICD-10-CM

## 2018-05-24 HISTORY — DX: Cerebral infarction, unspecified: I63.9

## 2018-05-28 DIAGNOSIS — M9904 Segmental and somatic dysfunction of sacral region: Secondary | ICD-10-CM | POA: Diagnosis not present

## 2018-05-28 DIAGNOSIS — M9903 Segmental and somatic dysfunction of lumbar region: Secondary | ICD-10-CM | POA: Diagnosis not present

## 2018-05-28 DIAGNOSIS — M9902 Segmental and somatic dysfunction of thoracic region: Secondary | ICD-10-CM | POA: Diagnosis not present

## 2018-05-28 DIAGNOSIS — M9905 Segmental and somatic dysfunction of pelvic region: Secondary | ICD-10-CM | POA: Diagnosis not present

## 2018-05-31 DIAGNOSIS — D510 Vitamin B12 deficiency anemia due to intrinsic factor deficiency: Secondary | ICD-10-CM | POA: Diagnosis not present

## 2018-06-07 DIAGNOSIS — I639 Cerebral infarction, unspecified: Secondary | ICD-10-CM | POA: Diagnosis not present

## 2018-06-09 ENCOUNTER — Encounter: Payer: Self-pay | Admitting: Physician Assistant

## 2018-06-09 NOTE — Progress Notes (Signed)
Message  Received: 6 days ago  Message Contents  Rolan, [place], Nedra Hai, PA-C        FYI,  Just letting you know that we have made several attempts to contact patient (including sending a letter) to schedule a cardiac event monitor with no response from patient.

## 2018-07-08 ENCOUNTER — Other Ambulatory Visit: Payer: Self-pay

## 2018-07-17 DIAGNOSIS — D510 Vitamin B12 deficiency anemia due to intrinsic factor deficiency: Secondary | ICD-10-CM | POA: Diagnosis not present

## 2018-07-17 DIAGNOSIS — Z23 Encounter for immunization: Secondary | ICD-10-CM | POA: Diagnosis not present

## 2018-07-22 DIAGNOSIS — M9902 Segmental and somatic dysfunction of thoracic region: Secondary | ICD-10-CM | POA: Diagnosis not present

## 2018-07-22 DIAGNOSIS — M9903 Segmental and somatic dysfunction of lumbar region: Secondary | ICD-10-CM | POA: Diagnosis not present

## 2018-07-22 DIAGNOSIS — M9904 Segmental and somatic dysfunction of sacral region: Secondary | ICD-10-CM | POA: Diagnosis not present

## 2018-07-22 DIAGNOSIS — M9905 Segmental and somatic dysfunction of pelvic region: Secondary | ICD-10-CM | POA: Diagnosis not present

## 2018-07-26 DIAGNOSIS — M9902 Segmental and somatic dysfunction of thoracic region: Secondary | ICD-10-CM | POA: Diagnosis not present

## 2018-07-26 DIAGNOSIS — M9903 Segmental and somatic dysfunction of lumbar region: Secondary | ICD-10-CM | POA: Diagnosis not present

## 2018-07-26 DIAGNOSIS — M9905 Segmental and somatic dysfunction of pelvic region: Secondary | ICD-10-CM | POA: Diagnosis not present

## 2018-07-26 DIAGNOSIS — M9904 Segmental and somatic dysfunction of sacral region: Secondary | ICD-10-CM | POA: Diagnosis not present

## 2018-08-23 DIAGNOSIS — D510 Vitamin B12 deficiency anemia due to intrinsic factor deficiency: Secondary | ICD-10-CM | POA: Diagnosis not present

## 2018-09-30 DIAGNOSIS — I480 Paroxysmal atrial fibrillation: Secondary | ICD-10-CM

## 2018-09-30 HISTORY — DX: Paroxysmal atrial fibrillation: I48.0

## 2018-10-01 DIAGNOSIS — D510 Vitamin B12 deficiency anemia due to intrinsic factor deficiency: Secondary | ICD-10-CM | POA: Diagnosis not present

## 2018-10-01 DIAGNOSIS — E039 Hypothyroidism, unspecified: Secondary | ICD-10-CM | POA: Diagnosis not present

## 2018-10-01 DIAGNOSIS — Z0000 Encounter for general adult medical examination without abnormal findings: Secondary | ICD-10-CM | POA: Diagnosis not present

## 2018-10-01 DIAGNOSIS — I4891 Unspecified atrial fibrillation: Secondary | ICD-10-CM | POA: Diagnosis not present

## 2018-10-01 DIAGNOSIS — Z6827 Body mass index (BMI) 27.0-27.9, adult: Secondary | ICD-10-CM | POA: Diagnosis not present

## 2018-10-01 DIAGNOSIS — Z79899 Other long term (current) drug therapy: Secondary | ICD-10-CM | POA: Diagnosis not present

## 2018-10-01 DIAGNOSIS — E785 Hyperlipidemia, unspecified: Secondary | ICD-10-CM | POA: Diagnosis not present

## 2018-10-16 DIAGNOSIS — H26491 Other secondary cataract, right eye: Secondary | ICD-10-CM | POA: Diagnosis not present

## 2018-11-21 DIAGNOSIS — L853 Xerosis cutis: Secondary | ICD-10-CM | POA: Diagnosis not present

## 2018-11-21 DIAGNOSIS — L578 Other skin changes due to chronic exposure to nonionizing radiation: Secondary | ICD-10-CM | POA: Diagnosis not present

## 2018-11-21 DIAGNOSIS — C4441 Basal cell carcinoma of skin of scalp and neck: Secondary | ICD-10-CM | POA: Diagnosis not present

## 2018-11-21 DIAGNOSIS — L820 Inflamed seborrheic keratosis: Secondary | ICD-10-CM | POA: Diagnosis not present

## 2018-12-09 DIAGNOSIS — Z125 Encounter for screening for malignant neoplasm of prostate: Secondary | ICD-10-CM | POA: Diagnosis not present

## 2018-12-09 DIAGNOSIS — M545 Low back pain: Secondary | ICD-10-CM | POA: Diagnosis not present

## 2018-12-09 DIAGNOSIS — K5900 Constipation, unspecified: Secondary | ICD-10-CM | POA: Diagnosis not present

## 2018-12-09 DIAGNOSIS — D519 Vitamin B12 deficiency anemia, unspecified: Secondary | ICD-10-CM | POA: Diagnosis not present

## 2018-12-09 DIAGNOSIS — D510 Vitamin B12 deficiency anemia due to intrinsic factor deficiency: Secondary | ICD-10-CM | POA: Diagnosis not present

## 2019-04-26 IMAGING — MR MR MRA HEAD W/O CM
10 of 13 series · 28 of 48 positions shown · non-contrast
Comparison: CT head without contrast 05/08/2018.

CLINICAL DATA: TIA, initial exam. Transient episode of right-sided
vision loss lasting approximately 3 minutes. Symptoms have resolved.

EXAM:
MRI HEAD WITHOUT CONTRAST
MRA HEAD WITHOUT CONTRAST
TECHNIQUE: Multiplanar, multiecho pulse sequences of the brain and surrounding
structures were obtained without intravenous contrast. Angiographic
images of the head were obtained using MRA technique without
contrast.

[Series 5: ax dwi_tracew · axial · 3.0mm · 1.50mm/px · z∈[-20,+113]mm · 6 of 92 slices shown]
[im 1/92]
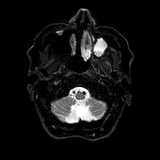
[im 19/92]
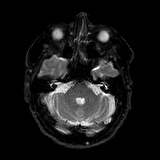
[im 37/92]
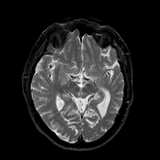
[im 55/92]
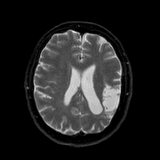
[im 73/92]
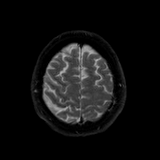
[im 92/92]
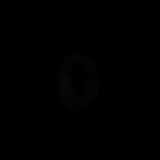

[Series 6: ax dwi_adc · axial · 3.0mm · 1.50mm/px · z∈[-20,+113]mm · 3 of 46 slices shown]
[im 1/46]
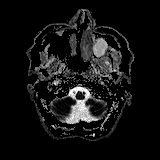
[im 23/46]
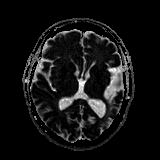
[im 46/46]
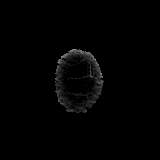

[Series 7: cor dwi_tracew · coronal · 5.0mm · 1.44mm/px · 4 of 68 slices shown]
[im 1/68]
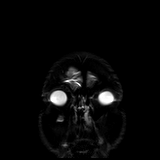
[im 23/68]
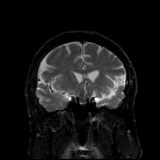
[im 45/68]
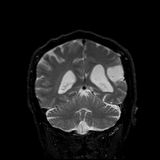
[im 68/68]
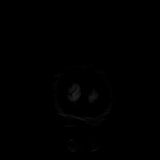

[Series 8: cor dwi_adc · coronal · 5.0mm · 1.44mm/px · 2 of 34 slices shown]
[im 1/34]
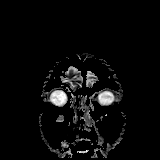
[im 34/34]
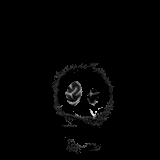

[Series 13: T1 · sagittal · 5.0mm · 0.75mm/px · 1 of 23 slices shown]
[im 1/23]
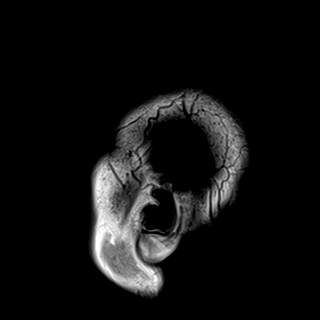

[Series 24: T2 · axial · 5.0mm · 0.72mm/px · z∈[-32,+110]mm · 2 of 25 slices shown (1 of 2)]
[im 1/25]
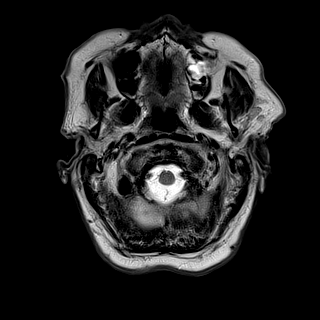
[im 25/25]
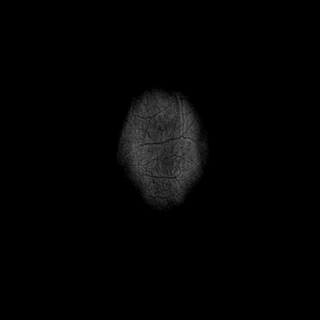

[Series 25: FLAIR · axial · 5.0mm · 0.90mm/px · z∈[-27,+128]mm · 2 of 27 slices shown]
[im 1/27]
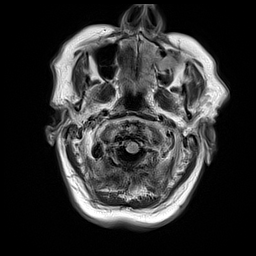
[im 27/27]
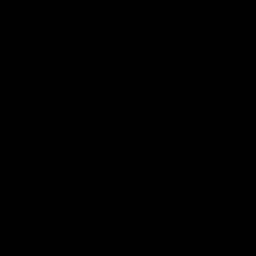

[Series 26: swi_images · axial · 3.0mm · 0.90mm/px · z∈[-27,+125]mm · 3 of 52 slices shown]
[im 1/52]
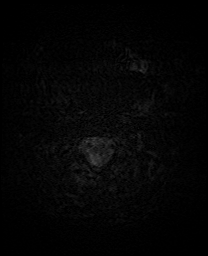
[im 26/52]
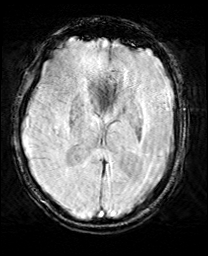
[im 52/52]
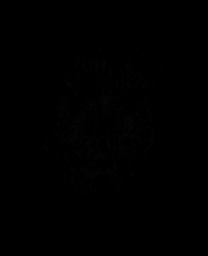

[Series 27: mip_images(sw) · axial · 24.0mm · 0.90mm/px · z∈[-16,+115]mm · 3 of 45 slices shown]
[im 1/45]
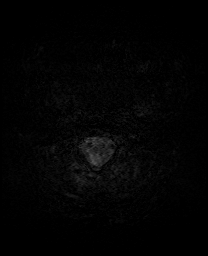
[im 23/45]
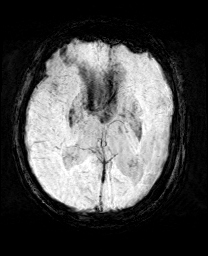
[im 45/45]
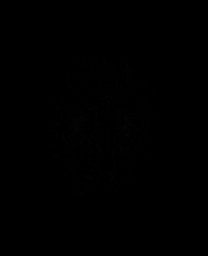

[Series 29: T2 · coronal · 5.0mm · 0.34mm/px · 2 of 29 slices shown (2 of 2)]
[im 1/29]
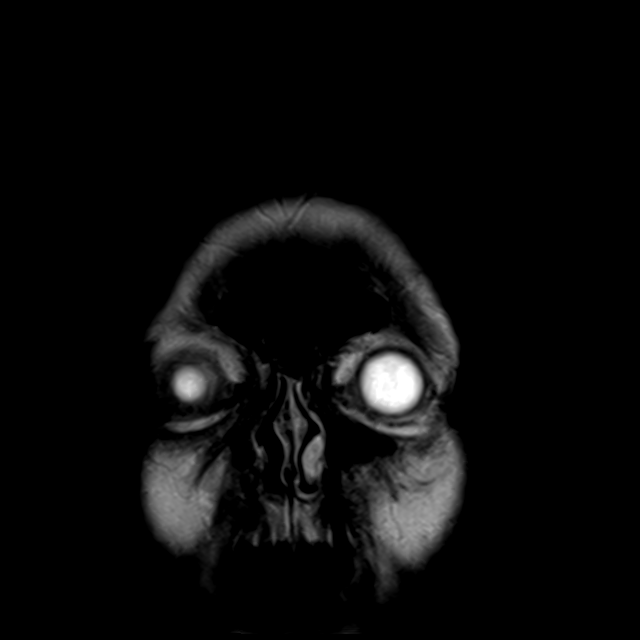
[im 29/29]
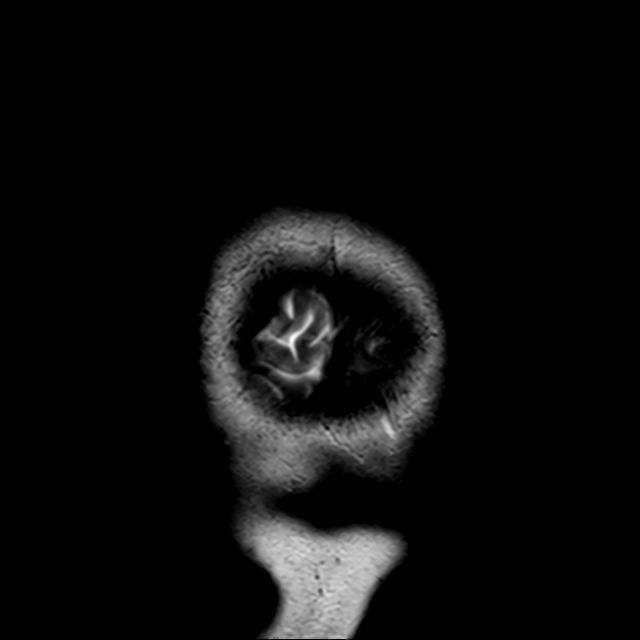

[28 of 48 positions shown; findings below may reference images not displayed]

FINDINGS: MRI HEAD FINDINGS

Brain: No acute infarct, hemorrhage, or mass lesion is present. The
ventricles are of normal size. A remote left frontal and parietal
lobe infarct is again noted. Associated ex vacuo dilation is present
with the remote infarct. Mild white matter changes are within normal
limits for age. No acute hemorrhage or mass lesion is present. There
is no significant extra-axial fluid collection.

The internal auditory canals are within normal limits. The brainstem
is within normal limits. A remote lacunar infarct is present within
the left cerebellum. Cerebellum is otherwise normal.

Vascular: Flow is present in the major intracranial arteries.

Skull and upper cervical spine: The skull base is within normal
limits. The craniocervical junction is normal. Upper cervical spine
demonstrates degenerative change at C3-4.

Sinuses/Orbits: A large polyp or mucous retention cyst is present
within the inferior left maxillary sinus. The paranasal sinuses and
mastoids right mastoid air cells are otherwise clear. There is fluid
in left mastoid air cells. No obstructing nasopharyngeal lesion is
present.

Other: None

MRA HEAD FINDINGS

Time-of-flight images demonstrate atherosclerotic changes in the
cavernous internal carotid arteries right greater than left. There
is no focal stenosis relative to the more distal vessels. There is a
very early bifurcation of the right MCA, a normal variant. Mild
medium and small vessel irregularity is present without a
significant proximal stenosis or occlusion. The inferior left MCA
division is markedly attenuated, consistent with the area remote
infarction. ACA and MCA branch vessels are otherwise within normal
limits.

The left PICA origin is visualized and normal. Vertebral arteries
are within normal limits bilaterally. The basilar artery is normal.
Both posterior cerebral arteries originate from basilar tip. There
is mild narrowing of the mid right P2 segment.
IMPRESSION: 1. No acute infarct.
2. Remote left frontal and parietal infarct is stable.
3. Chronic narrowing or occlusion of the inferior posterior left M2
branches consistent with the remote infarct.
4. Mild medium and small vessel disease without other significant
proximal stenosis, aneurysm, or occlusion within the
circle-of-Willis.
5. Left mastoid effusion. No obstructing nasopharyngeal lesion is
present.
6. Degenerative changes in the upper cervical spine.

## 2019-05-15 DIAGNOSIS — G459 Transient cerebral ischemic attack, unspecified: Secondary | ICD-10-CM | POA: Diagnosis not present

## 2019-05-15 DIAGNOSIS — G40209 Localization-related (focal) (partial) symptomatic epilepsy and epileptic syndromes with complex partial seizures, not intractable, without status epilepticus: Secondary | ICD-10-CM | POA: Diagnosis not present

## 2019-05-15 DIAGNOSIS — I639 Cerebral infarction, unspecified: Secondary | ICD-10-CM | POA: Diagnosis not present

## 2019-05-27 DIAGNOSIS — R972 Elevated prostate specific antigen [PSA]: Secondary | ICD-10-CM | POA: Diagnosis not present

## 2019-05-28 DIAGNOSIS — N401 Enlarged prostate with lower urinary tract symptoms: Secondary | ICD-10-CM | POA: Diagnosis not present

## 2019-05-28 DIAGNOSIS — R972 Elevated prostate specific antigen [PSA]: Secondary | ICD-10-CM | POA: Diagnosis not present

## 2019-05-28 DIAGNOSIS — N138 Other obstructive and reflux uropathy: Secondary | ICD-10-CM | POA: Diagnosis not present

## 2019-06-16 DIAGNOSIS — Z23 Encounter for immunization: Secondary | ICD-10-CM | POA: Diagnosis not present

## 2019-07-16 ENCOUNTER — Other Ambulatory Visit: Payer: Self-pay

## 2019-09-20 DIAGNOSIS — E7800 Pure hypercholesterolemia, unspecified: Secondary | ICD-10-CM | POA: Diagnosis not present

## 2019-09-20 DIAGNOSIS — I4891 Unspecified atrial fibrillation: Secondary | ICD-10-CM | POA: Diagnosis not present

## 2019-09-20 DIAGNOSIS — E039 Hypothyroidism, unspecified: Secondary | ICD-10-CM | POA: Diagnosis not present

## 2019-09-26 DIAGNOSIS — E785 Hyperlipidemia, unspecified: Secondary | ICD-10-CM | POA: Diagnosis not present

## 2019-09-26 DIAGNOSIS — E7800 Pure hypercholesterolemia, unspecified: Secondary | ICD-10-CM | POA: Diagnosis not present

## 2019-09-26 DIAGNOSIS — D510 Vitamin B12 deficiency anemia due to intrinsic factor deficiency: Secondary | ICD-10-CM | POA: Diagnosis not present

## 2019-09-26 DIAGNOSIS — D519 Vitamin B12 deficiency anemia, unspecified: Secondary | ICD-10-CM | POA: Diagnosis not present

## 2019-09-26 DIAGNOSIS — Z6826 Body mass index (BMI) 26.0-26.9, adult: Secondary | ICD-10-CM | POA: Diagnosis not present

## 2019-09-26 DIAGNOSIS — J302 Other seasonal allergic rhinitis: Secondary | ICD-10-CM | POA: Diagnosis not present

## 2019-09-26 DIAGNOSIS — Z79899 Other long term (current) drug therapy: Secondary | ICD-10-CM | POA: Diagnosis not present

## 2019-09-29 DIAGNOSIS — Z23 Encounter for immunization: Secondary | ICD-10-CM | POA: Diagnosis not present

## 2019-10-07 DIAGNOSIS — Z7901 Long term (current) use of anticoagulants: Secondary | ICD-10-CM

## 2019-10-07 HISTORY — DX: Long term (current) use of anticoagulants: Z79.01

## 2019-10-19 DIAGNOSIS — E7800 Pure hypercholesterolemia, unspecified: Secondary | ICD-10-CM | POA: Diagnosis not present

## 2019-10-19 DIAGNOSIS — R972 Elevated prostate specific antigen [PSA]: Secondary | ICD-10-CM | POA: Diagnosis not present

## 2019-10-22 DIAGNOSIS — N401 Enlarged prostate with lower urinary tract symptoms: Secondary | ICD-10-CM | POA: Diagnosis not present

## 2019-10-22 DIAGNOSIS — N138 Other obstructive and reflux uropathy: Secondary | ICD-10-CM | POA: Diagnosis not present

## 2019-10-22 DIAGNOSIS — R972 Elevated prostate specific antigen [PSA]: Secondary | ICD-10-CM | POA: Diagnosis not present

## 2019-10-26 DIAGNOSIS — Z23 Encounter for immunization: Secondary | ICD-10-CM | POA: Diagnosis not present

## 2019-10-30 DIAGNOSIS — Z7901 Long term (current) use of anticoagulants: Secondary | ICD-10-CM | POA: Diagnosis not present

## 2019-10-30 DIAGNOSIS — I639 Cerebral infarction, unspecified: Secondary | ICD-10-CM | POA: Diagnosis not present

## 2019-10-30 DIAGNOSIS — I480 Paroxysmal atrial fibrillation: Secondary | ICD-10-CM | POA: Diagnosis not present

## 2019-11-13 DIAGNOSIS — I480 Paroxysmal atrial fibrillation: Secondary | ICD-10-CM | POA: Diagnosis not present

## 2019-11-19 DIAGNOSIS — I4891 Unspecified atrial fibrillation: Secondary | ICD-10-CM | POA: Diagnosis not present

## 2019-11-19 DIAGNOSIS — E7800 Pure hypercholesterolemia, unspecified: Secondary | ICD-10-CM | POA: Diagnosis not present

## 2019-11-27 DIAGNOSIS — L821 Other seborrheic keratosis: Secondary | ICD-10-CM | POA: Diagnosis not present

## 2019-11-27 DIAGNOSIS — D225 Melanocytic nevi of trunk: Secondary | ICD-10-CM | POA: Diagnosis not present

## 2019-11-27 DIAGNOSIS — L0390 Cellulitis, unspecified: Secondary | ICD-10-CM | POA: Diagnosis not present

## 2019-11-27 DIAGNOSIS — D519 Vitamin B12 deficiency anemia, unspecified: Secondary | ICD-10-CM | POA: Diagnosis not present

## 2019-11-27 DIAGNOSIS — L578 Other skin changes due to chronic exposure to nonionizing radiation: Secondary | ICD-10-CM | POA: Diagnosis not present

## 2019-12-24 DIAGNOSIS — R1084 Generalized abdominal pain: Secondary | ICD-10-CM | POA: Diagnosis not present

## 2019-12-24 DIAGNOSIS — D510 Vitamin B12 deficiency anemia due to intrinsic factor deficiency: Secondary | ICD-10-CM | POA: Diagnosis not present

## 2019-12-24 DIAGNOSIS — Z6826 Body mass index (BMI) 26.0-26.9, adult: Secondary | ICD-10-CM | POA: Diagnosis not present

## 2020-02-03 DIAGNOSIS — D519 Vitamin B12 deficiency anemia, unspecified: Secondary | ICD-10-CM | POA: Diagnosis not present

## 2020-02-18 DIAGNOSIS — D519 Vitamin B12 deficiency anemia, unspecified: Secondary | ICD-10-CM | POA: Diagnosis not present

## 2020-02-18 DIAGNOSIS — E7800 Pure hypercholesterolemia, unspecified: Secondary | ICD-10-CM | POA: Diagnosis not present

## 2020-02-18 DIAGNOSIS — I4891 Unspecified atrial fibrillation: Secondary | ICD-10-CM | POA: Diagnosis not present

## 2020-03-16 ENCOUNTER — Encounter: Payer: Self-pay | Admitting: Cardiology

## 2020-03-17 ENCOUNTER — Other Ambulatory Visit: Payer: Self-pay

## 2020-03-17 ENCOUNTER — Ambulatory Visit (INDEPENDENT_AMBULATORY_CARE_PROVIDER_SITE_OTHER): Payer: Medicare Other | Admitting: Cardiology

## 2020-03-17 ENCOUNTER — Encounter: Payer: Self-pay | Admitting: Cardiology

## 2020-03-17 VITALS — BP 120/70 | Ht 70.5 in | Wt 188.0 lb

## 2020-03-17 DIAGNOSIS — I480 Paroxysmal atrial fibrillation: Secondary | ICD-10-CM | POA: Diagnosis not present

## 2020-03-17 DIAGNOSIS — Z8673 Personal history of transient ischemic attack (TIA), and cerebral infarction without residual deficits: Secondary | ICD-10-CM | POA: Diagnosis not present

## 2020-03-17 MED ORDER — RIVAROXABAN 20 MG PO TABS: 20.0000 mg | ORAL_TABLET | Freq: Every day | ORAL | 6 refills | Status: DC

## 2020-03-17 NOTE — Patient Instructions (Signed)

## 2020-03-17 NOTE — Progress Notes (Signed)
Cardiology Office Note:    Date:  03/17/2020   ID:  Edward Boone, DOB 01/26/1946, MRN [id number]  PCP:  Angelina Sheriff, MD  Cardiologist:  No primary care provider on file.  Electrophysiologist:  None   Referring MD: Angelina Sheriff, MD   The patient is here to establish cardiac care  History of Present Illness:    Edward Boone is a 74 y.o. male with a hx of   Atrial fibrillation on Xarelto, dyslipidemia, history of CVA, hypothyroidism is presenting to establish cardiac care.  The patient per [facility] did see a cardiologist from [facility] on October 30, 2019.  The patient is here today he does not have any specific complaints.  He notes that he wanted to reestablish cardiac care this is why he is presenting.    He denies any chest pain, shortness of breath, nausea, vomiting.  I will reviewed his PCP notes which show evidence that the patient did complain of chest pain which was intermittent and 5 days in a row.  It was described at that time as stabbing right quadrant.  But today the patient denies any pain.  Past Medical History:  Diagnosis Date  . Atrial fibrillation ([place])   . BPH (benign prostatic hyperplasia)   . Dyslipidemia   . Elevated PSA   . Epilepsy ([place])   . GERD (gastroesophageal reflux disease)   . Hypothyroidism (acquired)   . Kidney stones   . Pernicious anemia   . Stroke [facility]) 2019    Past Surgical History:  Procedure Laterality Date  . CATARACT EXTRACTION, BILATERAL  2019  . kidney stone removal     X3    Current Medications: No outpatient medications have been marked as taking for the 03/17/20 encounter (Office Visit) with Berniece Salines, DO.     Allergies:   Patient has no known allergies.   Social History   Socioeconomic History  . Marital status: Married    Spouse name: Not on file  . Number of children: Not on file  . Years of education: Not on file  . Highest education level: Not on file  Occupational History  .  Occupation: retired  Tobacco Use  . Smoking status: Never Smoker  . Smokeless tobacco: Never Used  Substance and Sexual Activity  . Alcohol use: No  . Drug use: No  . Sexual activity: Not on file  Other Topics Concern  . Not on file  Social History Narrative  . Not on file   Social Determinants of Health   Financial Resource Strain:   . Difficulty of Paying Living Expenses:   Food Insecurity:   . Worried About Charity fundraiser in the Last Year:   . Arboriculturist in the Last Year:   Transportation Needs:   . Film/video editor (Medical):   Marland Kitchen Lack of Transportation (Non-Medical):   Physical Activity:   . Days of Exercise per Week:   . Minutes of Exercise per Session:   Stress:   . Feeling of Stress :   Social Connections:   . Frequency of Communication with Friends and Family:   . Frequency of Social Gatherings with Friends and Family:   . Attends Religious Services:   . Active Member of Clubs or Organizations:   . Attends Archivist Meetings:   Marland Kitchen Marital Status:      Family History: The patient's family history includes Asthma in his sister; Atrial fibrillation  in his father; CVA (age of onset: 21) in his father; Heart attack in his father.  ROS:   Review of Systems  Constitution: Negative for decreased appetite, fever and weight gain.  HENT: Negative for congestion, ear discharge, hoarse voice and sore throat.   Eyes: Negative for discharge, redness, vision loss in right eye and visual halos.  Cardiovascular: Negative for chest pain, dyspnea on exertion, leg swelling, orthopnea and palpitations.  Respiratory: Negative for cough, hemoptysis, shortness of breath and snoring.   Endocrine: Negative for heat intolerance and polyphagia.  Hematologic/Lymphatic: Negative for bleeding problem. Does not bruise/bleed easily.  Skin: Negative for flushing, nail changes, rash and suspicious lesions.  Musculoskeletal: Negative for arthritis, joint pain, muscle  cramps, myalgias, neck pain and stiffness.  Gastrointestinal: Negative for abdominal pain, bowel incontinence, diarrhea and excessive appetite.  Genitourinary: Negative for decreased libido, genital sores and incomplete emptying.  Neurological: Negative for brief paralysis, focal weakness, headaches and loss of balance.  Psychiatric/Behavioral: Negative for altered mental status, depression and suicidal ideas.  Allergic/Immunologic: Negative for HIV exposure and persistent infections.    EKGs/Labs/Other Studies Reviewed:    The following studies were reviewed today:   EKG:  The ekg ordered today demonstrates sinus rhythm, heart rate 86 bpm   recent echo on November 13, 2019 from [facility] SUMMARY  The left ventricular size is normal.  There is normal left ventricular wall thickness.  LV ejection fraction = 55-60%.  The left ventricular wall motion is normal.  Upper septal hypertrophy (sigmoid septum), normal variant.   -  FINDINGS:  LEFT VENTRICLE  The left ventricular size is normal. There is normal left ventricular  wall thickness. Upper septal hypertrophy (sigmoid septum), normal  variant. LV ejection fraction = 55-60%. Left ventricular systolic  function is normal. The left ventricular wall motion is normal.   -  RIGHT VENTRICLE  The right ventricle is normal in size and function.   LEFT ATRIUM  The left atrial size is normal.   RIGHT ATRIUM  Right atrial size is normal.  -  AORTIC VALVE  Structurally normal aortic valve. There is trace aortic regurgitation.  There is no aortic stenosis.  -  MITRAL VALVE  The mitral valve is normal in structure and function. There is no  mitral regurgitation noted.  -  TRICUSPID VALVE  The tricuspid valve is normal in structure and function. No tricuspid  regurgitation.  -  PULMONIC VALVE  The pulmonic valve is not well visualized. There is no pulmonic  valvular regurgitation.  -  ARTERIES  The aortic sinus is normal  size.  -  VENOUS  IVC size was normal.  -  EFFUSION  There is no pericardial effusion. There is no pleural effusion.   Recent Labs: Blood work done at his PCP office on Dec 24, 2019: CBC: WBC 6.9, hemoglobin 12.9, hematocrit 37, platelet 186 Chemistry: Glucose 93, BUN 17, creatinine 1.0, total bili 0.3, AST 12, ALT 9, alk phos 111, calcium 9.0, sodium 138, potassium 4.4, chloride 107, bicarb 22, total protein 7.2, albumin 4.4, globulin 2.8  Recent Lipid Panel Profile from October 02, 2019: Triglyceride 246, total cholesterol 166, HDL 33, LDL 84    Component Value Date/Time   CHOL 138 05/08/2018 0621   TRIG 76 05/08/2018 0621   HDL 36 (L) 05/08/2018 0621   CHOLHDL 3.8 05/08/2018 0621   VLDL 15 05/08/2018 0621   LDLCALC 87 05/08/2018 0621    Physical Exam:    VS:  BP  120/70 (BP Location: Left Arm, Patient Position: Sitting, Cuff Size: Normal)   Ht 5' 10.5" (1.791 m)   Wt 188 lb (85.3 kg)   BMI 26.59 kg/m     Wt Readings from Last 3 Encounters:  03/17/20 188 lb (85.3 kg)  12/24/19 190 lb (86.2 kg)  05/08/18 187 lb 2.7 oz (84.9 kg)     GEN: Well nourished, well developed in no acute distress HEENT: Normal NECK: No JVD; No carotid bruits LYMPHATICS: No lymphadenopathy CARDIAC: S1S2 noted,RRR, no murmurs, rubs, gallops RESPIRATORY:  Clear to auscultation without rales, wheezing or rhonchi  ABDOMEN: Soft, non-tender, non-distended, +bowel sounds, no guarding. EXTREMITIES: No edema, No cyanosis, no clubbing MUSCULOSKELETAL:  No deformity  SKIN: Warm and dry NEUROLOGIC:  Alert and oriented x 3, non-focal PSYCHIATRIC:  Normal affect, good insight  ASSESSMENT:    1. Paroxysmal atrial fibrillation (HCC)   2. History of CVA (cerebrovascular accident)    PLAN:     No changes will be made with his medication regimen.  He is sinus rhythm today.  Were able to review his medications again.  We will keep him on his Xarelto.  He is not on any rate limiting agents at this time  however per prior cardiologist he was noted to be on Cardizem.  The patient reports that none of his medication has changed.  We will verify his medication list as well. In terms of the chest pain that was reported by his PCP the patient denies it today we will continue to monitor the patient.  The pain actually does sound atypical if it recurred he is going to call my office and were going to be able to reevaluate and reassess the need for any further testing. I have reviewed his echocardiogram which was done in March 2021 by his previous cardiologist.  The patient is in agreement with the above plan. The patient left the office in stable condition.  The patient will follow up in 6 months or sooner if needed.   Medication Adjustments/Labs and Tests Ordered: Current medicines are reviewed at length with the patient today.  Concerns regarding medicines are outlined above.  Orders Placed This Encounter  Procedures  . EKG 12-Lead   Meds ordered this encounter  Medications  . DISCONTD: rivaroxaban (XARELTO) 20 MG TABS tablet    Sig: Take 1 tablet (20 mg total) by mouth daily.    Dispense:  30 tablet    Refill:  6  . rivaroxaban (XARELTO) 20 MG TABS tablet    Sig: Take 1 tablet (20 mg total) by mouth daily.    Dispense:  30 tablet    Refill:  6    Patient Instructions  Medication Instructions:  Your physician recommends that you continue on your current medications as directed. Please refer to the Current Medication list given to you today.  *If you need a refill on your cardiac medications before your next appointment, please call your pharmacy*   Lab Work: None.  If you have labs (blood work) drawn today and your tests are completely normal, you will receive your results only by: Marland Kitchen MyChart Message (if you have MyChart) OR . A paper copy in the mail If you have any lab test that is abnormal or we need to change your treatment, we will call you to review the  results.   Testing/Procedures: None.    Follow-Up: At [facility], you and your health needs are our priority.  As part of our continuing mission to  provide you with exceptional heart care, we have created designated Provider Care Teams.  These Care Teams include your primary Cardiologist (physician) and Advanced Practice Providers (APPs -  Physician Assistants and Nurse Practitioners) who all work together to provide you with the care you need, when you need it.  We recommend signing up for the patient portal called "MyChart".  Sign up information is provided on this After Visit Summary.  MyChart is used to connect with patients for Virtual Visits (Telemedicine).  Patients are able to view lab/test results, encounter notes, upcoming appointments, etc.  Non-urgent messages can be sent to your provider as well.   To learn more about what you can do with MyChart, go to [email or web address].    Your next appointment:   6 month(s)  The format for your next appointment:   In Person  Provider:   Berniece Salines, DO   Other Instructions      Adopting a Healthy Lifestyle.  Know what a healthy weight is for you (roughly BMI <25) and aim to maintain this   Aim for 7+ servings of fruits and vegetables daily   65-80+ fluid ounces of water or unsweet tea for healthy kidneys   Limit to max 1 drink of alcohol per day; avoid smoking/tobacco   Limit animal fats in diet for cholesterol and heart health - choose grass fed whenever available   Avoid highly processed foods, and foods high in saturated/trans fats   Aim for low stress - take time to unwind and care for your mental health   Aim for 150 min of moderate intensity exercise weekly for heart health, and weights twice weekly for bone health   Aim for 7-9 hours of sleep daily   When it comes to diets, agreement about the perfect plan isnt easy to find, even among the experts. Experts at the [employer]  developed an idea known as the Healthy Eating Plate. Just imagine a plate divided into logical, healthy portions.   The emphasis is on diet quality:   Load up on vegetables and fruits - one-half of your plate: Aim for color and variety, and remember that potatoes dont count.   Go for whole grains - one-quarter of your plate: Whole wheat, barley, wheat berries, quinoa, oats, brown rice, and foods made with them. If you want pasta, go with whole wheat pasta.   Protein power - one-quarter of your plate: Fish, chicken, beans, and nuts are all healthy, versatile protein sources. Limit red meat.   The diet, however, does go beyond the plate, offering a few other suggestions.   Use healthy plant oils, such as olive, canola, soy, corn, sunflower and peanut. Check the labels, and avoid partially hydrogenated oil, which have unhealthy trans fats.   If youre thirsty, drink water. Coffee and tea are good in moderation, but skip sugary drinks and limit milk and dairy products to one or two daily servings.   The type of carbohydrate in the diet is more important than the amount. Some sources of carbohydrates, such as vegetables, fruits, whole grains, and beans-are healthier than others.   Finally, stay active  Signed, Berniece Salines, DO  03/17/2020 2:57 PM    [facility]

## 2020-03-20 DIAGNOSIS — I4891 Unspecified atrial fibrillation: Secondary | ICD-10-CM | POA: Diagnosis not present

## 2020-03-20 DIAGNOSIS — E7800 Pure hypercholesterolemia, unspecified: Secondary | ICD-10-CM | POA: Diagnosis not present

## 2020-03-20 DIAGNOSIS — D519 Vitamin B12 deficiency anemia, unspecified: Secondary | ICD-10-CM | POA: Diagnosis not present

## 2020-03-26 DIAGNOSIS — D510 Vitamin B12 deficiency anemia due to intrinsic factor deficiency: Secondary | ICD-10-CM | POA: Diagnosis not present

## 2020-04-20 DIAGNOSIS — I4891 Unspecified atrial fibrillation: Secondary | ICD-10-CM | POA: Diagnosis not present

## 2020-04-20 DIAGNOSIS — E7800 Pure hypercholesterolemia, unspecified: Secondary | ICD-10-CM | POA: Diagnosis not present

## 2020-04-20 DIAGNOSIS — D519 Vitamin B12 deficiency anemia, unspecified: Secondary | ICD-10-CM | POA: Diagnosis not present

## 2020-04-23 DIAGNOSIS — D510 Vitamin B12 deficiency anemia due to intrinsic factor deficiency: Secondary | ICD-10-CM | POA: Diagnosis not present

## 2020-05-20 DIAGNOSIS — D510 Vitamin B12 deficiency anemia due to intrinsic factor deficiency: Secondary | ICD-10-CM | POA: Diagnosis not present

## 2020-05-20 DIAGNOSIS — I4891 Unspecified atrial fibrillation: Secondary | ICD-10-CM | POA: Diagnosis not present

## 2020-05-20 DIAGNOSIS — E559 Vitamin D deficiency, unspecified: Secondary | ICD-10-CM | POA: Diagnosis not present

## 2020-05-20 DIAGNOSIS — E7800 Pure hypercholesterolemia, unspecified: Secondary | ICD-10-CM | POA: Diagnosis not present

## 2020-05-20 DIAGNOSIS — Z79899 Other long term (current) drug therapy: Secondary | ICD-10-CM | POA: Diagnosis not present

## 2020-05-20 DIAGNOSIS — D519 Vitamin B12 deficiency anemia, unspecified: Secondary | ICD-10-CM | POA: Diagnosis not present

## 2020-05-27 DIAGNOSIS — R972 Elevated prostate specific antigen [PSA]: Secondary | ICD-10-CM | POA: Diagnosis not present

## 2020-06-01 DIAGNOSIS — N138 Other obstructive and reflux uropathy: Secondary | ICD-10-CM | POA: Diagnosis not present

## 2020-06-01 DIAGNOSIS — N401 Enlarged prostate with lower urinary tract symptoms: Secondary | ICD-10-CM | POA: Diagnosis not present

## 2020-06-01 DIAGNOSIS — R972 Elevated prostate specific antigen [PSA]: Secondary | ICD-10-CM | POA: Diagnosis not present

## 2020-06-01 DIAGNOSIS — N200 Calculus of kidney: Secondary | ICD-10-CM | POA: Insufficient documentation

## 2020-06-01 HISTORY — DX: Calculus of kidney: N20.0

## 2020-06-18 DIAGNOSIS — Z23 Encounter for immunization: Secondary | ICD-10-CM | POA: Diagnosis not present

## 2020-06-19 DIAGNOSIS — I4891 Unspecified atrial fibrillation: Secondary | ICD-10-CM | POA: Diagnosis not present

## 2020-06-19 DIAGNOSIS — E7800 Pure hypercholesterolemia, unspecified: Secondary | ICD-10-CM | POA: Diagnosis not present

## 2020-06-19 DIAGNOSIS — D519 Vitamin B12 deficiency anemia, unspecified: Secondary | ICD-10-CM | POA: Diagnosis not present

## 2020-07-01 DIAGNOSIS — E538 Deficiency of other specified B group vitamins: Secondary | ICD-10-CM | POA: Diagnosis not present

## 2020-07-05 DIAGNOSIS — Z23 Encounter for immunization: Secondary | ICD-10-CM | POA: Diagnosis not present

## 2020-07-13 DIAGNOSIS — G40209 Localization-related (focal) (partial) symptomatic epilepsy and epileptic syndromes with complex partial seizures, not intractable, without status epilepticus: Secondary | ICD-10-CM | POA: Diagnosis not present

## 2020-07-20 DIAGNOSIS — D519 Vitamin B12 deficiency anemia, unspecified: Secondary | ICD-10-CM | POA: Diagnosis not present

## 2020-07-20 DIAGNOSIS — E7800 Pure hypercholesterolemia, unspecified: Secondary | ICD-10-CM | POA: Diagnosis not present

## 2020-07-20 DIAGNOSIS — I4891 Unspecified atrial fibrillation: Secondary | ICD-10-CM | POA: Diagnosis not present

## 2020-08-04 DIAGNOSIS — D510 Vitamin B12 deficiency anemia due to intrinsic factor deficiency: Secondary | ICD-10-CM | POA: Diagnosis not present

## 2020-08-04 DIAGNOSIS — H26491 Other secondary cataract, right eye: Secondary | ICD-10-CM | POA: Diagnosis not present

## 2020-08-19 DIAGNOSIS — I4891 Unspecified atrial fibrillation: Secondary | ICD-10-CM | POA: Diagnosis not present

## 2020-08-19 DIAGNOSIS — D519 Vitamin B12 deficiency anemia, unspecified: Secondary | ICD-10-CM | POA: Diagnosis not present

## 2020-08-19 DIAGNOSIS — E7800 Pure hypercholesterolemia, unspecified: Secondary | ICD-10-CM | POA: Diagnosis not present

## 2020-09-07 DIAGNOSIS — H26491 Other secondary cataract, right eye: Secondary | ICD-10-CM | POA: Diagnosis not present

## 2020-09-15 ENCOUNTER — Other Ambulatory Visit: Payer: Self-pay

## 2020-09-15 DIAGNOSIS — G40909 Epilepsy, unspecified, not intractable, without status epilepticus: Secondary | ICD-10-CM | POA: Insufficient documentation

## 2020-09-15 DIAGNOSIS — N400 Enlarged prostate without lower urinary tract symptoms: Secondary | ICD-10-CM | POA: Insufficient documentation

## 2020-09-15 DIAGNOSIS — D510 Vitamin B12 deficiency anemia due to intrinsic factor deficiency: Secondary | ICD-10-CM | POA: Insufficient documentation

## 2020-09-16 ENCOUNTER — Encounter: Payer: Self-pay | Admitting: Cardiology

## 2020-09-16 ENCOUNTER — Ambulatory Visit (INDEPENDENT_AMBULATORY_CARE_PROVIDER_SITE_OTHER): Payer: Medicare Other | Admitting: Cardiology

## 2020-09-16 ENCOUNTER — Other Ambulatory Visit: Payer: Self-pay

## 2020-09-16 VITALS — BP 140/72 | HR 88 | Ht 71.0 in | Wt 187.8 lb

## 2020-09-16 DIAGNOSIS — I480 Paroxysmal atrial fibrillation: Secondary | ICD-10-CM | POA: Diagnosis not present

## 2020-09-16 DIAGNOSIS — I10 Essential (primary) hypertension: Secondary | ICD-10-CM

## 2020-09-16 DIAGNOSIS — E785 Hyperlipidemia, unspecified: Secondary | ICD-10-CM | POA: Diagnosis not present

## 2020-09-16 NOTE — Patient Instructions (Signed)
Medication Instructions:  Your physician recommends that you continue on your current medications as directed. Please refer to the Current Medication list given to you today.  *If you need a refill on your cardiac medications before your next appointment, please call your pharmacy*   Lab Work: Your physician recommends that you return for lab work: [place], [place]  If you have labs (blood work) drawn today and your tests are completely normal, you will receive your results only by: Marland Kitchen MyChart Message (if you have MyChart) OR . A paper copy in the mail If you have any lab test that is abnormal or we need to change your treatment, we will call you to review the results.   Testing/Procedures: None  Follow-Up: At [facility], you and your health needs are our priority.  As part of our continuing mission to provide you with exceptional heart care, we have created designated Provider Care Teams.  These Care Teams include your primary Cardiologist (physician) and Advanced Practice Providers (APPs -  Physician Assistants and Nurse Practitioners) who all work together to provide you with the care you need, when you need it.  We recommend signing up for the patient portal called "MyChart".  Sign up information is provided on this After Visit Summary.  MyChart is used to connect with patients for Virtual Visits (Telemedicine).  Patients are able to view lab/test results, encounter notes, upcoming appointments, etc.  Non-urgent messages can be sent to your provider as well.   To learn more about what you can do with MyChart, go to [email or web address].    Your next appointment:   6 month(s)  The format for your next appointment:   In Person  Provider:   Berniece Salines, DO   Other Instructions

## 2020-09-16 NOTE — Progress Notes (Signed)
Cardiology Office Note:    Date:  09/16/2020   ID:  Edward Boone, DOB 11/02/1945, MRN [id number]  PCP:  Angelina Sheriff, MD  Cardiologist:  Berniece Salines, DO  Electrophysiologist:  None   Referring MD: Angelina Sheriff, MD   I am doing well  History of Present Illness:    Edward Boone is a 75 y.o. male with a hx of paroxysmal atrial fibrillation on Xarelto, dyslipidemia, history of CVA, hypothyroidism is here today for follow-up visit PFR saw the patient in July 2021 after he presented to establish cardiac care.  At that time no changes were made to his medications.  Today he tells me he has been doing well since I last saw him.  He offers no chest pain or shortness of breath.  He feels like he is a 75 year old man.  Past Medical History:  Diagnosis Date  . Atrial fibrillation (Edward Boone)   . BPH (benign prostatic hyperplasia)   . Dyslipidemia   . Elevated PSA   . Epilepsy (Edward Boone)   . GERD (gastroesophageal reflux disease)   . Hypothyroidism (acquired)   . Kidney stones   . Pernicious anemia   . Stroke [facility]) 2019    Past Surgical History:  Procedure Laterality Date  . CATARACT EXTRACTION, BILATERAL  2019  . kidney stone removal     X3    Current Medications: Current Meds  Medication Sig  . dutasteride (AVODART) 0.5 MG capsule   . famotidine (PEPCID) 40 MG tablet Take 40 mg by mouth.  . fluticasone (FLONASE) 50 MCG/ACT nasal spray Place 2 sprays into both nostrils daily.  Marland Kitchen levothyroxine (SYNTHROID, LEVOTHROID) 25 MCG tablet Take 25 mcg by mouth daily before breakfast.   . PHENObarbital (LUMINAL) 30 MG tablet Take 30-60 mg by mouth 2 (two) times daily. 30 mg qAM, 60 mg qhs  . rivaroxaban (XARELTO) 20 MG TABS tablet Take 1 tablet (20 mg total) by mouth daily.  . rosuvastatin (CRESTOR) 20 MG tablet Take 1 tablet by mouth at bedtime.  . vitamin B-12 (CYANOCOBALAMIN) 1000 MCG tablet Take 1,000 mcg by mouth daily.  . [DISCONTINUED] famotidine (PEPCID) 20 MG tablet Take 2  tablets by mouth daily.     Allergies:   Patient has no known allergies.   Social History   Socioeconomic History  . Marital status: Married    Spouse name: Not on file  . Number of children: Not on file  . Years of education: Not on file  . Highest education level: Not on file  Occupational History  . Occupation: retired  Tobacco Use  . Smoking status: Never Smoker  . Smokeless tobacco: Never Used  Substance and Sexual Activity  . Alcohol use: No  . Drug use: No  . Sexual activity: Not on file  Other Topics Concern  . Not on file  Social History Narrative  . Not on file   Social Determinants of Health   Financial Resource Strain: Not on file  Food Insecurity: Not on file  Transportation Needs: Not on file  Physical Activity: Not on file  Stress: Not on file  Social Connections: Not on file     Family History: The patient's family history includes Asthma in his sister; Atrial fibrillation in his father; CVA (age of onset: 68) in his father; Heart attack in his father.  ROS:   Review of Systems  Constitution: Negative for decreased appetite, fever and weight gain.  HENT: Negative for congestion, ear discharge, hoarse  voice and sore throat.   Eyes: Negative for discharge, redness, vision loss in right eye and visual halos.  Cardiovascular: Negative for chest pain, dyspnea on exertion, leg swelling, orthopnea and palpitations.  Respiratory: Negative for cough, hemoptysis, shortness of breath and snoring.   Endocrine: Negative for heat intolerance and polyphagia.  Hematologic/Lymphatic: Negative for bleeding problem. Does not bruise/bleed easily.  Skin: Negative for flushing, nail changes, rash and suspicious lesions.  Musculoskeletal: Negative for arthritis, joint pain, muscle cramps, myalgias, neck pain and stiffness.  Gastrointestinal: Negative for abdominal pain, bowel incontinence, diarrhea and excessive appetite.  Genitourinary: Negative for decreased libido,  genital sores and incomplete emptying.  Neurological: Negative for brief paralysis, focal weakness, headaches and loss of balance.  Psychiatric/Behavioral: Negative for altered mental status, depression and suicidal ideas.  Allergic/Immunologic: Negative for HIV exposure and persistent infections.    EKGs/Labs/Other Studies Reviewed:    The following studies were reviewed today:   EKG:  None today  recent echo on November 13, 2019 [facility]  The left ventricular size is normal.  There is normal left ventricular wall thickness.  LV ejection fraction = 55-60%.  The left ventricular wall motion is normal.  Upper septal hypertrophy (sigmoid septum), normal variant.   -  FINDINGS:  LEFT VENTRICLE  The left ventricular size is normal. There is normal left ventricular  wall thickness. Upper septal hypertrophy (sigmoid septum), normal  variant. LV ejection fraction = 55-60%. Left ventricular systolic  function is normal. The left ventricular wall motion is normal.   -  RIGHT VENTRICLE  The right ventricle is normal in size and function.   LEFT ATRIUM  The left atrial size is normal.   RIGHT ATRIUM  Right atrial size is normal.  -  AORTIC VALVE  Structurally normal aortic valve. There is trace aortic regurgitation.  There is no aortic stenosis.  -  MITRAL VALVE  The mitral valve is normal in structure and function. There is no  mitral regurgitation noted.  -  TRICUSPID VALVE  The tricuspid valve is normal in structure and function. No tricuspid  regurgitation.  -  PULMONIC VALVE  The pulmonic valve is not well visualized. There is no pulmonic  valvular regurgitation.  -  ARTERIES  The aortic sinus is normal size.  -  VENOUS  IVC size was normal.  -  EFFUSION  There is no pericardial effusion. There is no pleural effusion.   Recent Labs: No results found for requested labs within last 8760 hours.  Recent Lipid Panel    Component Value  Date/Time   CHOL 138 05/08/2018 0621   TRIG 76 05/08/2018 0621   HDL 36 (L) 05/08/2018 0621   CHOLHDL 3.8 05/08/2018 0621   VLDL 15 05/08/2018 0621   LDLCALC 87 05/08/2018 0621    Physical Exam:    VS:  BP 140/72   Pulse 88   Ht 5\' 11"  (1.803 m)   Wt 187 lb 12.8 oz (85.2 kg)   SpO2 98%   BMI 26.19 kg/m     Wt Readings from Last 3 Encounters:  09/16/20 187 lb 12.8 oz (85.2 kg)  03/17/20 188 lb (85.3 kg)  12/24/19 190 lb (86.2 kg)     GEN: Well nourished, well developed in no acute distress HEENT: Normal NECK: No JVD; No carotid bruits LYMPHATICS: No lymphadenopathy CARDIAC: S1S2 noted,RRR, no murmurs, rubs, gallops RESPIRATORY:  Clear to auscultation without rales, wheezing or rhonchi  ABDOMEN: Soft, non-tender, non-distended, +bowel sounds, no  guarding. EXTREMITIES: No edema, No cyanosis, no clubbing MUSCULOSKELETAL:  No deformity  SKIN: Warm and dry NEUROLOGIC:  Alert and oriented x 3, non-focal PSYCHIATRIC:  Normal affect, good insight  ASSESSMENT:    1. Hypertension, unspecified type   2. Paroxysmal atrial fibrillation (HCC)   3. Dyslipidemia    PLAN:     1.  He appears to be doing well from a cardiovascular standpoint.  We will continue plan of her current medication regimen.  No changes will be made. 2.  Blood work will be done today to assess BMP and mag today  The patient is in agreement with the above plan. The patient left the office in stable condition.  The patient will follow up in 6 months or sooner if needed.   Medication Adjustments/Labs and Tests Ordered: Current medicines are reviewed at length with the patient today.  Concerns regarding medicines are outlined above.  Orders Placed This Encounter  Procedures  . Basic metabolic panel  . Magnesium   No orders of the defined types were placed in this encounter.   Patient Instructions  Medication Instructions:  Your physician recommends that you continue on your current medications as  directed. Please refer to the Current Medication list given to you today.  *If you need a refill on your cardiac medications before your next appointment, please call your pharmacy*   Lab Work: Your physician recommends that you return for lab work: [place], [place]  If you have labs (blood work) drawn today and your tests are completely normal, you will receive your results only by: Marland Kitchen MyChart Message (if you have MyChart) OR . A paper copy in the mail If you have any lab test that is abnormal or we need to change your treatment, we will call you to review the results.   Testing/Procedures: None  Follow-Up: At [facility], you and your health needs are our priority.  As part of our continuing mission to provide you with exceptional heart care, we have created designated Provider Care Teams.  These Care Teams include your primary Cardiologist (physician) and Advanced Practice Providers (APPs -  Physician Assistants and Nurse Practitioners) who all work together to provide you with the care you need, when you need it.  We recommend signing up for the patient portal called "MyChart".  Sign up information is provided on this After Visit Summary.  MyChart is used to connect with patients for Virtual Visits (Telemedicine).  Patients are able to view lab/test results, encounter notes, upcoming appointments, etc.  Non-urgent messages can be sent to your provider as well.   To learn more about what you can do with MyChart, go to [email or web address].    Your next appointment:   6 month(s)  The format for your next appointment:   In Person  Provider:   Berniece Salines, DO   Other Instructions      Adopting a Healthy Lifestyle.  Know what a healthy weight is for you (roughly BMI <25) and aim to maintain this   Aim for 7+ servings of fruits and vegetables daily   65-80+ fluid ounces of water or unsweet tea for healthy kidneys   Limit to max 1 drink of alcohol per day; avoid  smoking/tobacco   Limit animal fats in diet for cholesterol and heart health - choose grass fed whenever available   Avoid highly processed foods, and foods high in saturated/trans fats   Aim for low stress - take time to unwind and care for your  mental health   Aim for 150 min of moderate intensity exercise weekly for heart health, and weights twice weekly for bone health   Aim for 7-9 hours of sleep daily   When it comes to diets, agreement about the perfect plan isnt easy to find, even among the experts. Experts at the [employer] developed an idea known as the Healthy Eating Plate. Just imagine a plate divided into logical, healthy portions.   The emphasis is on diet quality:   Load up on vegetables and fruits - one-half of your plate: Aim for color and variety, and remember that potatoes dont count.   Go for whole grains - one-quarter of your plate: Whole wheat, barley, wheat berries, quinoa, oats, brown rice, and foods made with them. If you want pasta, go with whole wheat pasta.   Protein power - one-quarter of your plate: Fish, chicken, beans, and nuts are all healthy, versatile protein sources. Limit red meat.   The diet, however, does go beyond the plate, offering a few other suggestions.   Use healthy plant oils, such as olive, canola, soy, corn, sunflower and peanut. Check the labels, and avoid partially hydrogenated oil, which have unhealthy trans fats.   If youre thirsty, drink water. Coffee and tea are good in moderation, but skip sugary drinks and limit milk and dairy products to one or two daily servings.   The type of carbohydrate in the diet is more important than the amount. Some sources of carbohydrates, such as vegetables, fruits, whole grains, and beans-are healthier than others.   Finally, stay active  Signed, Berniece Salines, DO  09/16/2020 1:50 PM    [facility]

## 2020-09-17 LAB — BASIC METABOLIC PANEL
BUN/Creatinine Ratio: 18 (ref 10–24)
BUN: 17 mg/dL (ref 8–27)
CO2: 22 mmol/L (ref 20–29)
Calcium: 8.8 mg/dL (ref 8.6–10.2)
Chloride: 108 mmol/L — ABNORMAL HIGH (ref 96–106)
Creatinine, Ser: 0.94 mg/dL (ref 0.76–1.27)
GFR calc Af Amer: 91 mL/min/{1.73_m2} (ref >59)
GFR calc non Af Amer: 79 mL/min/{1.73_m2} (ref >59)
Glucose: 118 mg/dL — ABNORMAL HIGH (ref 65–99)
Potassium: 4.3 mmol/L (ref 3.5–5.2)
Sodium: 144 mmol/L (ref 134–144)

## 2020-09-17 LAB — MAGNESIUM: Magnesium: 2.1 mg/dL (ref 1.6–2.3)

## 2020-09-19 DIAGNOSIS — D519 Vitamin B12 deficiency anemia, unspecified: Secondary | ICD-10-CM | POA: Diagnosis not present

## 2020-09-19 DIAGNOSIS — I4891 Unspecified atrial fibrillation: Secondary | ICD-10-CM | POA: Diagnosis not present

## 2020-09-19 DIAGNOSIS — E7800 Pure hypercholesterolemia, unspecified: Secondary | ICD-10-CM | POA: Diagnosis not present

## 2020-09-29 DIAGNOSIS — D510 Vitamin B12 deficiency anemia due to intrinsic factor deficiency: Secondary | ICD-10-CM | POA: Diagnosis not present

## 2020-10-18 DIAGNOSIS — D519 Vitamin B12 deficiency anemia, unspecified: Secondary | ICD-10-CM | POA: Diagnosis not present

## 2020-10-18 DIAGNOSIS — E7800 Pure hypercholesterolemia, unspecified: Secondary | ICD-10-CM | POA: Diagnosis not present

## 2020-10-18 DIAGNOSIS — I4891 Unspecified atrial fibrillation: Secondary | ICD-10-CM | POA: Diagnosis not present

## 2020-10-27 DIAGNOSIS — E559 Vitamin D deficiency, unspecified: Secondary | ICD-10-CM | POA: Diagnosis not present

## 2020-10-27 DIAGNOSIS — D510 Vitamin B12 deficiency anemia due to intrinsic factor deficiency: Secondary | ICD-10-CM | POA: Diagnosis not present

## 2020-11-01 DIAGNOSIS — W57XXXA Bitten or stung by nonvenomous insect and other nonvenomous arthropods, initial encounter: Secondary | ICD-10-CM | POA: Diagnosis not present

## 2020-11-01 DIAGNOSIS — R21 Rash and other nonspecific skin eruption: Secondary | ICD-10-CM | POA: Diagnosis not present

## 2020-11-01 DIAGNOSIS — Z0000 Encounter for general adult medical examination without abnormal findings: Secondary | ICD-10-CM | POA: Diagnosis not present

## 2020-11-01 DIAGNOSIS — Z6825 Body mass index (BMI) 25.0-25.9, adult: Secondary | ICD-10-CM | POA: Diagnosis not present

## 2020-11-03 DIAGNOSIS — L309 Dermatitis, unspecified: Secondary | ICD-10-CM | POA: Diagnosis not present

## 2020-11-03 DIAGNOSIS — L0390 Cellulitis, unspecified: Secondary | ICD-10-CM | POA: Diagnosis not present

## 2020-11-17 DIAGNOSIS — I4891 Unspecified atrial fibrillation: Secondary | ICD-10-CM | POA: Diagnosis not present

## 2020-11-17 DIAGNOSIS — D510 Vitamin B12 deficiency anemia due to intrinsic factor deficiency: Secondary | ICD-10-CM | POA: Diagnosis not present

## 2020-11-17 DIAGNOSIS — E7800 Pure hypercholesterolemia, unspecified: Secondary | ICD-10-CM | POA: Diagnosis not present

## 2020-11-29 DIAGNOSIS — L821 Other seborrheic keratosis: Secondary | ICD-10-CM | POA: Diagnosis not present

## 2020-11-29 DIAGNOSIS — E538 Deficiency of other specified B group vitamins: Secondary | ICD-10-CM | POA: Diagnosis not present

## 2020-11-29 DIAGNOSIS — L0390 Cellulitis, unspecified: Secondary | ICD-10-CM | POA: Diagnosis not present

## 2020-11-30 ENCOUNTER — Telehealth: Payer: Self-pay | Admitting: Cardiology

## 2020-11-30 NOTE — Telephone Encounter (Signed)
Edward Boone was calling to see if our office had in sample of rivaroxaban (XARELTO) 20 MG TABS tablet that we can give the patient. They are all out of samples. If we dont they are asking that we put a prescription in for the patient. Please advise

## 2020-12-03 ENCOUNTER — Other Ambulatory Visit: Payer: Self-pay

## 2020-12-03 DIAGNOSIS — Z23 Encounter for immunization: Secondary | ICD-10-CM | POA: Diagnosis not present

## 2020-12-03 MED ORDER — RIVAROXABAN 20 MG PO TABS: 20.0000 mg | ORAL_TABLET | Freq: Every day | ORAL | 6 refills | Status: DC

## 2020-12-03 NOTE — Telephone Encounter (Signed)
Sent to [facility]

## 2020-12-03 NOTE — Telephone Encounter (Signed)
Prescription refill request for Xarelto received.  Indication:atrial fib Last office visit:1/22  [place] Weight:85.2 kg Age:75 Scr:0.94 1/22 CrCl:81.83 ml/min  Prescription refilled

## 2020-12-18 DIAGNOSIS — E7800 Pure hypercholesterolemia, unspecified: Secondary | ICD-10-CM | POA: Diagnosis not present

## 2020-12-18 DIAGNOSIS — I4891 Unspecified atrial fibrillation: Secondary | ICD-10-CM | POA: Diagnosis not present

## 2020-12-18 DIAGNOSIS — D510 Vitamin B12 deficiency anemia due to intrinsic factor deficiency: Secondary | ICD-10-CM | POA: Diagnosis not present

## 2021-01-17 DIAGNOSIS — E7800 Pure hypercholesterolemia, unspecified: Secondary | ICD-10-CM | POA: Diagnosis not present

## 2021-01-17 DIAGNOSIS — I4891 Unspecified atrial fibrillation: Secondary | ICD-10-CM | POA: Diagnosis not present

## 2021-01-17 DIAGNOSIS — D510 Vitamin B12 deficiency anemia due to intrinsic factor deficiency: Secondary | ICD-10-CM | POA: Diagnosis not present

## 2021-01-19 DIAGNOSIS — D510 Vitamin B12 deficiency anemia due to intrinsic factor deficiency: Secondary | ICD-10-CM | POA: Diagnosis not present

## 2021-01-24 DIAGNOSIS — E7800 Pure hypercholesterolemia, unspecified: Secondary | ICD-10-CM | POA: Diagnosis not present

## 2021-01-24 DIAGNOSIS — Z6826 Body mass index (BMI) 26.0-26.9, adult: Secondary | ICD-10-CM | POA: Diagnosis not present

## 2021-01-24 DIAGNOSIS — Z1331 Encounter for screening for depression: Secondary | ICD-10-CM | POA: Diagnosis not present

## 2021-01-24 DIAGNOSIS — Z79899 Other long term (current) drug therapy: Secondary | ICD-10-CM | POA: Diagnosis not present

## 2021-01-24 DIAGNOSIS — E039 Hypothyroidism, unspecified: Secondary | ICD-10-CM | POA: Diagnosis not present

## 2021-01-24 DIAGNOSIS — Z9181 History of falling: Secondary | ICD-10-CM | POA: Diagnosis not present

## 2021-02-08 DIAGNOSIS — D649 Anemia, unspecified: Secondary | ICD-10-CM | POA: Diagnosis not present

## 2021-02-17 ENCOUNTER — Telehealth: Payer: Self-pay | Admitting: Oncology

## 2021-02-17 DIAGNOSIS — E7800 Pure hypercholesterolemia, unspecified: Secondary | ICD-10-CM | POA: Diagnosis not present

## 2021-02-17 DIAGNOSIS — D510 Vitamin B12 deficiency anemia due to intrinsic factor deficiency: Secondary | ICD-10-CM | POA: Diagnosis not present

## 2021-02-17 DIAGNOSIS — I4891 Unspecified atrial fibrillation: Secondary | ICD-10-CM | POA: Diagnosis not present

## 2021-02-17 NOTE — Telephone Encounter (Signed)
Patient referred by Dr Lovette Cliche for Pernicious Anemia.  Appt made for 03/01/21 Labs 10:30 am - Consult 11:00 am

## 2021-02-28 ENCOUNTER — Other Ambulatory Visit: Payer: Self-pay | Admitting: Oncology

## 2021-02-28 DIAGNOSIS — D510 Vitamin B12 deficiency anemia due to intrinsic factor deficiency: Secondary | ICD-10-CM

## 2021-02-28 NOTE — Progress Notes (Signed)
Edward Boone  [address] [place],  [place]  [address] [phone]  Clinic Day:  03/01/2021  Referring physician: Angelina Sheriff, MD   HISTORY OF PRESENT ILLNESS:  The patient is a 75 y.o. male  who I was asked to consult upon for persistent anemia.  Per the patient, he claims he has been receiving monthly B12 injections for pernicious anemia for at least the past year.  Nevertheless, labs at his primary care office have shown persistent anemia.  He denies having any overt forms of blood loss to explain his anemia.  He claims to have had a colonoscopy 10 years ago, for which a polyp was removed.  He denies being placed on any new medications which have the ability to cause anemia via bone marrow suppression.  He admits to getting fatigued more easily now versus previously.  To his knowledge, there is no family history of anemia or other hematologic disorders.  PAST MEDICAL HISTORY:   Past Medical History:  Diagnosis Date   Atrial fibrillation (HCC)    BPH (benign prostatic hyperplasia)    Dyslipidemia    Elevated PSA    Epilepsy (HCC)    GERD (gastroesophageal reflux disease)    Heart murmur    Hypothyroidism (acquired)    Kidney stones    Pernicious anemia    Stroke ([place]) 2019    PAST SURGICAL HISTORY:   Past Surgical History:  Procedure Laterality Date   CATARACT EXTRACTION, BILATERAL  2019   kidney stone removal     X3   POLYPECTOMY      CURRENT MEDICATIONS:   Current Outpatient Medications  Medication Sig Dispense Refill   dutasteride (AVODART) 0.5 MG capsule      famotidine (PEPCID) 40 MG tablet Take 40 mg by mouth.     fluticasone (FLONASE) 50 MCG/ACT nasal spray Place 2 sprays into both nostrils daily.     levothyroxine (SYNTHROID, LEVOTHROID) 25 MCG tablet Take 25 mcg by mouth daily before breakfast.      PHENObarbital (LUMINAL) 30 MG tablet Take 30-60 mg by mouth 2 (two) times daily. 30 mg qAM, 60 mg qhs     phenytoin  (DILANTIN) 100 MG ER capsule Take 100-200 mg by mouth 2 (two) times daily. 100 mg qAM, 200 mg qhs     rivaroxaban (XARELTO) 20 MG TABS tablet Take 1 tablet (20 mg total) by mouth daily. 30 tablet 6   rosuvastatin (CRESTOR) 20 MG tablet Take 1 tablet by mouth at bedtime.     vitamin B-12 (CYANOCOBALAMIN) 1000 MCG tablet Take 1,000 mcg by mouth daily.     No current facility-administered medications for this visit.    ALLERGIES:  No Known Allergies  FAMILY HISTORY:   Family History  Problem Relation Age of Onset   CVA Father 77   Atrial fibrillation Father    Heart attack Father    Asthma Sister    Breast cancer Maternal Aunt    Throat cancer Maternal Grandfather   His father died from a brain hemorrhage.  His mother died from postsurgical bowel complications.    SOCIAL HISTORY:  The patient was born in [place].  He lives in town with his wife of 62 years.  He has 4 children and 17 grandchildren.  He is a retired Nurse, learning disability.  There is no history of alcohol or tobacco abuse.    REVIEW OF SYSTEMS:  Review of Systems  Constitutional:  Negative for fatigue, fever and unexpected  weight change.  Respiratory:  Negative for chest tightness, cough, hemoptysis and shortness of breath.   Cardiovascular:  Positive for palpitations. Negative for chest pain.  Gastrointestinal:  Negative for abdominal distention, abdominal pain, blood in stool, constipation, diarrhea, nausea and vomiting.  Genitourinary:  Negative for dysuria, frequency and hematuria.   Musculoskeletal:  Negative for arthralgias, back pain and myalgias.  Skin:  Negative for itching and rash.  Neurological:  Negative for dizziness, headaches and light-headedness.  Psychiatric/Behavioral:  Negative for depression and suicidal ideas. The patient is not nervous/anxious.     PHYSICAL EXAM:  Blood pressure (!) 154/74, pulse 83, temperature 98.5 F (36.9 C), resp. rate 18, height 5\' 11"  (1.803 m), weight 186 lb 3.2  oz (84.5 kg), SpO2 97 %. Wt Readings from Last 3 Encounters:  03/01/21 186 lb 3.2 oz (84.5 kg)  09/16/20 187 lb 12.8 oz (85.2 kg)  03/17/20 188 lb (85.3 kg)   Body mass index is 25.97 kg/m. Performance status (ECOG): 1 - Symptomatic but completely ambulatory Physical Exam Constitutional:      Appearance: Normal appearance. He is not ill-appearing.  HENT:     Mouth/Throat:     Mouth: Mucous membranes are moist.     Pharynx: Oropharynx is clear. No oropharyngeal exudate or posterior oropharyngeal erythema.  Cardiovascular:     Rate and Rhythm: Normal rate and regular rhythm.     Heart sounds: No murmur heard.   No friction rub. No gallop.  Pulmonary:     Effort: Pulmonary effort is normal. No respiratory distress.     Breath sounds: Normal breath sounds. No wheezing, rhonchi or rales.  Chest:  Breasts:    Right: No axillary adenopathy or supraclavicular adenopathy.     Left: No axillary adenopathy or supraclavicular adenopathy.  Abdominal:     General: Bowel sounds are normal. There is no distension.     Palpations: Abdomen is soft. There is no mass.     Tenderness: There is no abdominal tenderness.  Musculoskeletal:        General: No swelling.     Right lower leg: No edema.     Left lower leg: No edema.  Lymphadenopathy:     Cervical: No cervical adenopathy.     Upper Body:     Right upper body: No supraclavicular or axillary adenopathy.     Left upper body: No supraclavicular or axillary adenopathy.     Lower Body: No right inguinal adenopathy. No left inguinal adenopathy.  Skin:    General: Skin is warm.     Coloration: Skin is not jaundiced.     Findings: No lesion or rash.  Neurological:     General: No focal deficit present.     Mental Status: He is alert and oriented to person, place, and time. Mental status is at baseline.     Cranial Nerves: Cranial nerves are intact.  Psychiatric:        Mood and Affect: Mood normal.        Behavior: Behavior normal.         Thought Content: Thought content normal.   . LABS:   CBC Latest Ref Rng & Units 03/01/2021 05/08/2018  WBC - 6.8 6.1  Hemoglobin 13.5 - 17.5 12.9(A) 12.2(L)  Hematocrit 41 - 53 38(A) 37.1(L)  Platelets 150 - 399 179 143(L)   CMP Latest Ref Rng & Units 03/01/2021 09/16/2020 05/08/2018  Glucose 65 - 99 mg/dL - 118(H) 98  BUN 4 - 21 18 17  15  Creatinine 0.6 - 1.3 0.9 0.94 1.05  Sodium 137 - 147 141 144 139  Potassium 3.4 - 5.3 4.1 4.3 4.4  Chloride 99 - 108 110(A) 108(H) 108  CO2 13 - 22 18 22 23   Calcium 8.7 - 10.7 8.8 8.8 8.9  Total Protein 6.5 - 8.1 g/dL - - 6.6  Total Bilirubin 0.3 - 1.2 mg/dL - - 0.4  Alkaline Phos 25 - 125 93 - 88  AST 14 - 40 25 - 15  ALT 10 - 40 14 - 11   ASSESSMENT & PLAN:  A 75 y.o. male who I was asked to consult upon for persistent anemia.  His hemoglobin of 12.9 today is not particularly low.   Furthermore, his MCV is normal at 94, which suggests B12 deficiency is no longer present.  For completeness, his iron, B12 and folate levels will be checked today to ensure no nutritional deficiencies are present.  I will also check a serum protein electrophoresis to ensure an underlying plasma cell dyscrasia is not present.  His kidney parameters today are normal, which essentially rules out renal insufficiency factoring into his anemia.  As his anemia is mild, it will be followed conservatively for now.  I will see him back in 2 months to go over his labs collected today, as well as to reassess his hemoglobin at that time.  The patient understands all the plans discussed today and is in agreement with them.  I do appreciate Angelina Sheriff, MD for his new consult.   Tresten Pantoja Macarthur Critchley, MD

## 2021-03-01 ENCOUNTER — Telehealth: Payer: Self-pay | Admitting: Oncology

## 2021-03-01 ENCOUNTER — Other Ambulatory Visit: Payer: Self-pay | Admitting: Oncology

## 2021-03-01 ENCOUNTER — Other Ambulatory Visit: Payer: Self-pay

## 2021-03-01 ENCOUNTER — Inpatient Hospital Stay (INDEPENDENT_AMBULATORY_CARE_PROVIDER_SITE_OTHER): Payer: Medicare Other | Admitting: Oncology

## 2021-03-01 ENCOUNTER — Other Ambulatory Visit: Payer: Self-pay | Admitting: Hematology and Oncology

## 2021-03-01 ENCOUNTER — Inpatient Hospital Stay: Payer: Medicare Other | Attending: Oncology

## 2021-03-01 ENCOUNTER — Encounter: Payer: Self-pay | Admitting: Oncology

## 2021-03-01 DIAGNOSIS — D649 Anemia, unspecified: Secondary | ICD-10-CM

## 2021-03-01 DIAGNOSIS — R002 Palpitations: Secondary | ICD-10-CM

## 2021-03-01 DIAGNOSIS — D510 Vitamin B12 deficiency anemia due to intrinsic factor deficiency: Secondary | ICD-10-CM | POA: Diagnosis not present

## 2021-03-01 HISTORY — DX: Anemia, unspecified: D64.9

## 2021-03-01 LAB — CBC AND DIFFERENTIAL
HCT: 38 — AB (ref 41–53)
Hemoglobin: 12.9 — AB (ref 13.5–17.5)
Neutrophils Absolute: 4.01
Platelets: 179 (ref 150–399)
WBC: 6.8

## 2021-03-01 LAB — BASIC METABOLIC PANEL
BUN: 18 (ref 4–21)
CO2: 18 (ref 13–22)
Chloride: 110 — AB (ref 99–108)
Creatinine: 0.9 (ref 0.6–1.3)
Glucose: 128
Potassium: 4.1 (ref 3.4–5.3)
Sodium: 141 (ref 137–147)

## 2021-03-01 LAB — HEPATIC FUNCTION PANEL
ALT: 14 (ref 10–40)
AST: 25 (ref 14–40)
Alkaline Phosphatase: 93 (ref 25–125)
Bilirubin, Total: 0.2

## 2021-03-01 LAB — COMPREHENSIVE METABOLIC PANEL
Albumin: 4.4 (ref 3.5–5.0)
Calcium: 8.8 (ref 8.7–10.7)

## 2021-03-01 LAB — CBC
MCV: 94 (ref 80–94)
RBC: 4.05 (ref 3.87–5.11)

## 2021-03-01 NOTE — Telephone Encounter (Signed)
Per 7/12 LOS, patient scheduled for Sept Appt's.  Gave patient Appt Summary

## 2021-03-03 LAB — PROTEIN ELECTROPHORESIS, SERUM
A/G Ratio: 1.4 (ref 0.7–1.7)
Albumin ELP: 4 g/dL (ref 2.9–4.4)
Alpha-1-Globulin: 0.2 g/dL (ref 0.0–0.4)
Alpha-2-Globulin: 0.7 g/dL (ref 0.4–1.0)
Beta Globulin: 0.9 g/dL (ref 0.7–1.3)
Gamma Globulin: 1 g/dL (ref 0.4–1.8)
Globulin, Total: 2.8 g/dL (ref 2.2–3.9)
Total Protein ELP: 6.8 g/dL (ref 6.0–8.5)

## 2021-03-08 LAB — IRON AND TIBC

## 2021-03-08 LAB — FERRITIN

## 2021-03-08 LAB — VITAMIN B12

## 2021-03-08 LAB — FOLATE

## 2021-03-20 DIAGNOSIS — D510 Vitamin B12 deficiency anemia due to intrinsic factor deficiency: Secondary | ICD-10-CM | POA: Diagnosis not present

## 2021-03-20 DIAGNOSIS — I4891 Unspecified atrial fibrillation: Secondary | ICD-10-CM | POA: Diagnosis not present

## 2021-03-20 DIAGNOSIS — E7800 Pure hypercholesterolemia, unspecified: Secondary | ICD-10-CM | POA: Diagnosis not present

## 2021-03-31 ENCOUNTER — Other Ambulatory Visit: Payer: Self-pay

## 2021-03-31 ENCOUNTER — Encounter: Payer: Self-pay | Admitting: Cardiology

## 2021-03-31 ENCOUNTER — Ambulatory Visit (INDEPENDENT_AMBULATORY_CARE_PROVIDER_SITE_OTHER): Payer: Medicare Other | Admitting: Cardiology

## 2021-03-31 VITALS — BP 114/70 | HR 81 | Ht 71.0 in | Wt 184.0 lb

## 2021-03-31 DIAGNOSIS — I480 Paroxysmal atrial fibrillation: Secondary | ICD-10-CM | POA: Diagnosis not present

## 2021-03-31 DIAGNOSIS — D510 Vitamin B12 deficiency anemia due to intrinsic factor deficiency: Secondary | ICD-10-CM | POA: Diagnosis not present

## 2021-03-31 DIAGNOSIS — Z8673 Personal history of transient ischemic attack (TIA), and cerebral infarction without residual deficits: Secondary | ICD-10-CM | POA: Diagnosis not present

## 2021-03-31 DIAGNOSIS — E785 Hyperlipidemia, unspecified: Secondary | ICD-10-CM

## 2021-03-31 NOTE — Patient Instructions (Signed)
Medication Instructions:  No medication changes. *If you need a refill on your cardiac medications before your next appointment, please call your pharmacy*   Lab Work: None ordered If you have labs (blood work) drawn today and your tests are completely normal, you will receive your results only by: [place] (if you have MyChart) OR A paper copy in the mail If you have any lab test that is abnormal or we need to change your treatment, we will call you to review the results.   Testing/Procedures: None ordered   Follow-Up: At [facility], you and your health needs are our priority.  As part of our continuing mission to provide you with exceptional heart care, we have created designated Provider Care Teams.  These Care Teams include your primary Cardiologist (physician) and Advanced Practice Providers (APPs -  Physician Assistants and Nurse Practitioners) who all work together to provide you with the care you need, when you need it.  We recommend signing up for the patient portal called "MyChart".  Sign up information is provided on this After Visit Summary.  MyChart is used to connect with patients for Virtual Visits (Telemedicine).  Patients are able to view lab/test results, encounter notes, upcoming appointments, etc.  Non-urgent messages can be sent to your provider as well.   To learn more about what you can do with MyChart, go to [email or web address].    Your next appointment:   12 month(s)  The format for your next appointment:   In Person  Provider:   Jyl Heinz, MD   Other Instructions NA

## 2021-03-31 NOTE — Progress Notes (Signed)
Cardiology Office Note:    Date:  03/31/2021   ID:  Edward Boone, DOB [birth year], MRN [id number]  PCP:  Angelina Sheriff, MD  Cardiologist:  Berniece Salines, DO  Electrophysiologist:  None   Referring MD: Angelina Sheriff, MD   Chief Complaint  Patient presents with   Follow-up    History of Present Illness:    Edward Boone is a 75 y.o. male with a hx of paroxysmal atrial fibrillation on Xarelto, dyslipidemia, history of CVA, hypothyroidism.  He is here today for follow-up visit.  Saw the patient on September 16, 2020 at that time he was doing well from a cardiovascular standpoint.  Today he is here for follow-up visit he offers no complaints at this time.  Past Medical History:  Diagnosis Date   Atrial fibrillation (HCC)    BPH (benign prostatic hyperplasia)    Dyslipidemia    Elevated PSA    Epilepsy (HCC)    GERD (gastroesophageal reflux disease)    Heart murmur    Hypothyroidism (acquired)    Kidney stones    Pernicious anemia    Stroke ([place]) 2019    Past Surgical History:  Procedure Laterality Date   CATARACT EXTRACTION, BILATERAL  2019   kidney stone removal     X3   POLYPECTOMY      Current Medications: Current Meds  Medication Sig   Cyanocobalamin (B-12 COMPLIANCE INJECTION) 1000 MCG/ML KIT Inject 1,000 mcg as directed every 30 (thirty) days.   dutasteride (AVODART) 0.5 MG capsule    famotidine (PEPCID) 40 MG tablet Take 40 mg by mouth.   fluticasone (FLONASE) 50 MCG/ACT nasal spray Place 2 sprays into both nostrils daily.   levothyroxine (SYNTHROID, LEVOTHROID) 25 MCG tablet Take 25 mcg by mouth daily before breakfast.    PHENObarbital (LUMINAL) 30 MG tablet Take 30-60 mg by mouth 2 (two) times daily. 30 mg qAM, 60 mg qhs   phenytoin (DILANTIN) 100 MG ER capsule Take 100-200 mg by mouth 2 (two) times daily. 100 mg qAM, 200 mg qhs   rivaroxaban (XARELTO) 20 MG TABS tablet Take 1 tablet (20 mg total) by mouth daily.   rosuvastatin (CRESTOR) 20 MG  tablet Take 1 tablet by mouth at bedtime.   vitamin B-12 (CYANOCOBALAMIN) 1000 MCG tablet Take 1,000 mcg by mouth daily.     Allergies:   Patient has no known allergies.   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  Tobacco Use   Smoking status: Never   Smokeless tobacco: Never  Substance and Sexual Activity   Alcohol use: No   Drug use: No   Sexual activity: Yes  Other Topics Concern   Not on file  Social History Narrative   Not on file   Social Determinants of Health   Financial Resource Strain: Not on file  Food Insecurity: Not on file  Transportation Needs: Not on file  Physical Activity: Not on file  Stress: Not on file  Social Connections: Not on file     Family History: The patient's family history includes Asthma in his sister; Atrial fibrillation in his father; Breast cancer in his maternal aunt; CVA (age of onset: 29) in his father; Heart attack in his father; Throat  cancer in his maternal grandfather.  ROS:   Review of Systems  Constitution: Negative for decreased appetite, fever and weight gain.  HENT: Negative for congestion, ear discharge, hoarse voice and sore throat.   Eyes: Negative for discharge, redness, vision loss in right eye and visual halos.  Cardiovascular: Negative for chest pain, dyspnea on exertion, leg swelling, orthopnea and palpitations.  Respiratory: Negative for cough, hemoptysis, shortness of breath and snoring.   Endocrine: Negative for heat intolerance and polyphagia.  Hematologic/Lymphatic: Negative for bleeding problem. Does not bruise/bleed easily.  Skin: Negative for flushing, nail changes, rash and suspicious lesions.  Musculoskeletal: Negative for arthritis, joint pain, muscle cramps, myalgias, neck pain and stiffness.   Gastrointestinal: Negative for abdominal pain, bowel incontinence, diarrhea and excessive appetite.  Genitourinary: Negative for decreased libido, genital sores and incomplete emptying.  Neurological: Negative for brief paralysis, focal weakness, headaches and loss of balance.  Psychiatric/Behavioral: Negative for altered mental status, depression and suicidal ideas.  Allergic/Immunologic: Negative for HIV exposure and persistent infections.    EKGs/Labs/Other Studies Reviewed:    The following studies were reviewed today:   EKG: EKG today shows evidence of sinus rhythm, heart rate 81 bpm.  recent echo on November 13, 2019 [facility] SUMMARY  The left ventricular size is normal.  There is normal left ventricular wall thickness.  LV ejection fraction = 55-60%.  The left ventricular wall motion is normal.  Upper septal hypertrophy (sigmoid septum), normal variant.   -  FINDINGS:  LEFT VENTRICLE  The left ventricular size is normal. There is normal left ventricular  wall thickness. Upper septal hypertrophy (sigmoid septum), normal  variant. LV ejection fraction = 55-60%. Left ventricular systolic  function is normal. The left ventricular wall motion is normal.   -  RIGHT VENTRICLE  The right ventricle is normal in size and function.   LEFT ATRIUM  The left atrial size is normal.   RIGHT ATRIUM  Right atrial size is normal.  -  AORTIC VALVE  Structurally normal aortic valve. There is trace aortic regurgitation.  There is no aortic stenosis.  -  MITRAL VALVE  The mitral valve is normal in structure and function. There is no  mitral regurgitation noted.  -  TRICUSPID VALVE  The tricuspid valve is normal in structure and function. No tricuspid  regurgitation.  -  PULMONIC VALVE  The pulmonic valve is not well visualized. There is no pulmonic  valvular regurgitation.  -  ARTERIES  The aortic sinus is normal size.  -  VENOUS  IVC size was normal.  -   EFFUSION  There is no pericardial effusion. There is no pleural effusion.    Recent Labs: 09/16/2020: Magnesium 2.1 03/01/2021: ALT 14; BUN 18; Creatinine 0.9; Hemoglobin 12.9; Platelets 179; Potassium 4.1; Sodium 141  Recent Lipid Panel    Component Value Date/Time   CHOL 138 05/08/2018 0621   TRIG 76 05/08/2018 0621   HDL 36 (L) 05/08/2018 0621   CHOLHDL 3.8 05/08/2018 0621   VLDL 15 05/08/2018 0621   LDLCALC 87 05/08/2018 0621    Physical Exam:    VS:  BP 114/70 (BP Location: Right Arm, Patient Position: Sitting, Cuff Size: Normal)   Pulse 81   Ht _0  (1.803 m)   Wt 184 lb (83.5 kg)   SpO2 97%   BMI 25.66 kg/m     Wt Readings from Last 3 Encounters:  03/31/21 184 lb (83.5 kg)  03/01/21 186 lb 3.2 oz (84.5 kg)  09/16/20 187 lb 12.8 oz (85.2 kg)     GEN: Well nourished, well developed in no acute distress HEENT: Normal NECK: No JVD; No carotid bruits LYMPHATICS: No lymphadenopathy CARDIAC: S1S2 noted,RRR, no murmurs, rubs, gallops RESPIRATORY:  Clear to auscultation without rales, wheezing or rhonchi  ABDOMEN: Soft, non-tender, non-distended, +bowel sounds, no guarding. EXTREMITIES: No edema, No cyanosis, no clubbing MUSCULOSKELETAL:  No deformity  SKIN: Warm and dry NEUROLOGIC:  Alert and oriented x 3, non-focal PSYCHIATRIC:  Normal affect, good insight  ASSESSMENT:    1. Dyslipidemia   2. Paroxysmal atrial fibrillation (HCC)   3. History of CVA (cerebrovascular accident)    PLAN:     He is doing well from a cardiovascular standpoint.  No medications will be changed today.  He recently had blood work with his PCP which I was able to see on his K PN.  The patient is in agreement with the above plan. The patient left the office in stable condition.  The patient will follow up in 12 months-at that time the patient will follow-up with Dr. Geraldo Pitter.   Medication Adjustments/Labs and Tests Ordered: Current medicines are reviewed at length with the patient  today.  Concerns regarding medicines are outlined above.  No orders of the defined types were placed in this encounter.  No orders of the defined types were placed in this encounter.   There are no Patient Instructions on file for this visit.   Adopting a Healthy Lifestyle.  Know what a healthy weight is for you (roughly BMI <25) and aim to maintain this   Aim for 7+ servings of fruits and vegetables daily   65-80+ fluid ounces of water or unsweet tea for healthy kidneys   Limit to max 1 drink of alcohol per day; avoid smoking/tobacco   Limit animal fats in diet for cholesterol and heart health - choose grass fed whenever available   Avoid highly processed foods, and foods high in saturated/trans fats   Aim for low stress - take time to unwind and care for your mental health   Aim for 150 min of moderate intensity exercise weekly for heart health, and weights twice weekly for bone health   Aim for 7-9 hours of sleep daily   When it comes to diets, agreement about the perfect plan isnt easy to find, even among the experts. Experts at the [employer] developed an idea known as the Healthy Eating Plate. Just imagine a plate divided into logical, healthy portions.   The emphasis is on diet quality:   Load up on vegetables and fruits - one-half of your plate: Aim for color and variety, and remember that potatoes dont count.   Go for whole grains - one-quarter of your plate: Whole wheat, barley, wheat berries, quinoa, oats, brown rice, and foods made with them. If you want pasta, go with whole wheat pasta.   Protein power - one-quarter of your plate: Fish, chicken, beans, and nuts are all healthy, versatile protein sources. Limit red meat.   The diet, however, does go beyond the plate, offering a few other suggestions.   Use healthy plant oils, such as olive, canola, soy, corn, sunflower and peanut. Check the labels, and avoid partially hydrogenated oil, which  have unhealthy trans fats.   If youre thirsty, drink water. Coffee and tea are good in moderation, but skip sugary drinks and limit milk and dairy products to one or two daily servings.   The type of carbohydrate  in the diet is more important than the amount. Some sources of carbohydrates, such as vegetables, fruits, whole grains, and beans-are healthier than others.   Finally, stay active  Signed, Berniece Salines, DO  03/31/2021 2:28 PM    [facility]

## 2021-04-20 DIAGNOSIS — D510 Vitamin B12 deficiency anemia due to intrinsic factor deficiency: Secondary | ICD-10-CM | POA: Diagnosis not present

## 2021-04-20 DIAGNOSIS — I4891 Unspecified atrial fibrillation: Secondary | ICD-10-CM | POA: Diagnosis not present

## 2021-04-20 DIAGNOSIS — E7800 Pure hypercholesterolemia, unspecified: Secondary | ICD-10-CM | POA: Diagnosis not present

## 2021-04-22 NOTE — Progress Notes (Signed)
[facility]  [address] [place],  [place]  [address] [phone]  Clinic Day:  05/02/2021  Referring physician: Angelina Sheriff, MD  This document serves as a record of services personally performed by Verlinda Slotnick Macarthur Critchley, MD. It was created on their behalf by [facility] E, a trained medical scribe. The creation of this record is based on the scribe's personal observations and the provider's statements to them.  HISTORY OF PRESENT ILLNESS:  The patient is a 75 y.o. male  who I recently began seeing for mild anemia.  He comes in today to go over all of his recent labs to determine the etiology behind this.  Of note, he has a history of B12 deficiency, for which he receives monthly B12 injections at his primary care office.  Since his last visit, the patient has been doing well.  He denies having increased fatigue or any overt forms of blood loss which concern him for progressive anemia.    PHYSICAL EXAM:  Blood pressure (!) 141/75, pulse 83, temperature 98.6 F (37 C), resp. rate 16, height 5' 11"  (1.803 m), weight 184 lb 3.2 oz (83.6 kg), SpO2 97 %. Wt Readings from Last 3 Encounters:  05/02/21 184 lb 3.2 oz (83.6 kg)  03/31/21 184 lb (83.5 kg)  03/01/21 186 lb 3.2 oz (84.5 kg)   Body mass index is 25.69 kg/m. Performance status (ECOG): 1 - Symptomatic but completely ambulatory Physical Exam Constitutional:      General: He is not in acute distress.    Appearance: Normal appearance. He is normal weight.  HENT:     Head: Normocephalic and atraumatic.  Eyes:     General: No scleral icterus.    Extraocular Movements: Extraocular movements intact.     Conjunctiva/sclera: Conjunctivae normal.     Pupils: Pupils are equal, round, and reactive to light.  Cardiovascular:     Rate and Rhythm: Normal rate and regular rhythm.     Pulses: Normal pulses.     Heart sounds: Normal heart sounds. No murmur heard.   No friction rub. No gallop.   Pulmonary:     Effort: Pulmonary effort is normal. No respiratory distress.     Breath sounds: Normal breath sounds.  Abdominal:     General: Bowel sounds are normal. There is no distension.     Palpations: Abdomen is soft. There is no hepatomegaly, splenomegaly or mass.     Tenderness: There is no abdominal tenderness.  Musculoskeletal:        General: Normal range of motion.     Cervical back: Normal range of motion and neck supple.     Right lower leg: No edema.     Left lower leg: No edema.  Lymphadenopathy:     Cervical: No cervical adenopathy.  Skin:    General: Skin is warm and dry.  Neurological:     General: No focal deficit present.     Mental Status: He is alert and oriented to person, place, and time. Mental status is at baseline.  Psychiatric:        Mood and Affect: Mood normal.        Behavior: Behavior normal.        Thought Content: Thought content normal.        Judgment: Judgment normal.   . LABS:   CBC Latest Ref Rng & Units 05/02/2021 03/01/2021 05/08/2018  WBC - 5.8 6.8 6.1  Hemoglobin 13.5 - 17.5 12.5(A) 12.9(A) 12.2(L)  Hematocrit 41 - 53 37(A) 38(A) 37.1(L)  Platelets 150 - 399 166 179 143(L)   CMP Latest Ref Rng & Units 03/01/2021 09/16/2020 05/08/2018  Glucose 65 - 99 mg/dL - 118(H) 98  BUN 4 - 21 18 17 15   Creatinine 0.6 - 1.3 0.9 0.94 1.05  Sodium 137 - 147 141 144 139  Potassium 3.4 - 5.3 4.1 4.3 4.4  Chloride 99 - 108 110(A) 108(H) 108  CO2 13 - 22 18 22 23   Calcium 8.7 - 10.7 8.8 8.8 8.9  Total Protein 6.5 - 8.1 g/dL - - 6.6  Total Bilirubin 0.3 - 1.2 mg/dL - - 0.4  Alkaline Phos 25 - 125 93 - 88  AST 14 - 40 25 - 15  ALT 10 - 40 14 - 11   Total Protein ELP 6.0 - 8.5 g/dL 6.8   Albumin ELP 2.9 - 4.4 g/dL 4.0   Alpha-1-Globulin 0.0 - 0.4 g/dL 0.2   Alpha-2-Globulin 0.4 - 1.0 g/dL 0.7   Beta Globulin 0.7 - 1.3 g/dL 0.9   Gamma Globulin 0.4 - 1.8 g/dL 1.0   M-Spike, % Not Observed g/dL Not Observed    Globulin, Total 2.2 - 3.9 g/dL 2.8  VC   A/G Ratio 0.7 - 1.7 1.4 VC     Ref. Range 05/02/2021 10:19 05/02/2021 10:37  Iron Latest Ref Range: 45 - 182 ug/dL 87   UIBC Latest Units: ug/dL 141   TIBC Latest Ref Range: 250 - 450 ug/dL 228 (L)   Saturation Ratios Latest Ref Range: 17.9 - 39.5 % 38   Ferritin Latest Ref Range: 24 - 336 ng/mL  218  Folate Latest Ref Range: >5.9 ng/mL 14.0   Vitamin B12 Latest Ref Range: 180 - 914 pg/mL 238     ASSESSMENT & PLAN:  A 75 y.o. male with mild anemia.  His hemoglobin of 12.5 today is not dangerously low.  Labs show no evidence of multiple myeloma, kidney disease or nutritional deficiencies being present.  As long as his hemoglobin remains above 11-12, his anemia will continue to be followed conservatively.  He knows to continue taking his monthly B12 injections to ensure adequate fortification of his cobalamin stores.   As he is doing well, I will see him back in 6 months for repeat clinical assessment.  The patient understands all the plans discussed today and is in agreement with them.  I, Rita Ohara, am acting as scribe for Marice Potter, MD    I have reviewed this report as typed by the medical scribe, and it is complete and accurate.  Shawniece Oyola Macarthur Critchley, MD

## 2021-04-28 ENCOUNTER — Telehealth: Payer: Self-pay | Admitting: Oncology

## 2021-04-28 NOTE — Telephone Encounter (Signed)
Patient called to verify 9/12 Appt's

## 2021-05-01 ENCOUNTER — Other Ambulatory Visit: Payer: Self-pay | Admitting: Oncology

## 2021-05-01 DIAGNOSIS — D539 Nutritional anemia, unspecified: Secondary | ICD-10-CM

## 2021-05-01 DIAGNOSIS — D649 Anemia, unspecified: Secondary | ICD-10-CM

## 2021-05-02 ENCOUNTER — Other Ambulatory Visit: Payer: Self-pay | Admitting: Oncology

## 2021-05-02 ENCOUNTER — Encounter: Payer: Self-pay | Admitting: Oncology

## 2021-05-02 ENCOUNTER — Inpatient Hospital Stay: Payer: Medicare Other | Attending: Oncology

## 2021-05-02 ENCOUNTER — Other Ambulatory Visit: Payer: Self-pay | Admitting: Hematology and Oncology

## 2021-05-02 ENCOUNTER — Other Ambulatory Visit: Payer: Self-pay

## 2021-05-02 ENCOUNTER — Inpatient Hospital Stay (INDEPENDENT_AMBULATORY_CARE_PROVIDER_SITE_OTHER): Payer: Medicare Other | Admitting: Oncology

## 2021-05-02 VITALS — BP 141/75 | HR 83 | Temp 98.6°F | Resp 16 | Ht 71.0 in | Wt 184.2 lb

## 2021-05-02 DIAGNOSIS — D510 Vitamin B12 deficiency anemia due to intrinsic factor deficiency: Secondary | ICD-10-CM | POA: Insufficient documentation

## 2021-05-02 DIAGNOSIS — D649 Anemia, unspecified: Secondary | ICD-10-CM

## 2021-05-02 DIAGNOSIS — E538 Deficiency of other specified B group vitamins: Secondary | ICD-10-CM | POA: Diagnosis not present

## 2021-05-02 DIAGNOSIS — D539 Nutritional anemia, unspecified: Secondary | ICD-10-CM

## 2021-05-02 LAB — CBC AND DIFFERENTIAL
HCT: 37 — AB (ref 41–53)
Hemoglobin: 12.5 — AB (ref 13.5–17.5)
Neutrophils Absolute: 3.42
Platelets: 166 (ref 150–399)
WBC: 5.8

## 2021-05-02 LAB — CBC
MCV: 94 (ref 80–94)
RBC: 3.94 (ref 3.87–5.11)

## 2021-05-02 LAB — FOLATE: Folate: 14 ng/mL (ref >5.9)

## 2021-05-02 LAB — IRON AND TIBC
Iron: 87 ug/dL (ref 45–182)
Saturation Ratios: 38 % (ref 17.9–39.5)
TIBC: 228 ug/dL — ABNORMAL LOW (ref 250–450)
UIBC: 141 ug/dL

## 2021-05-02 LAB — FERRITIN: Ferritin: 218 ng/mL (ref 24–336)

## 2021-05-02 LAB — VITAMIN B12: Vitamin B-12: 238 pg/mL (ref 180–914)

## 2021-05-05 DIAGNOSIS — Z23 Encounter for immunization: Secondary | ICD-10-CM | POA: Diagnosis not present

## 2021-05-20 DIAGNOSIS — D510 Vitamin B12 deficiency anemia due to intrinsic factor deficiency: Secondary | ICD-10-CM | POA: Diagnosis not present

## 2021-05-20 DIAGNOSIS — I4891 Unspecified atrial fibrillation: Secondary | ICD-10-CM | POA: Diagnosis not present

## 2021-05-26 DIAGNOSIS — D510 Vitamin B12 deficiency anemia due to intrinsic factor deficiency: Secondary | ICD-10-CM | POA: Diagnosis not present

## 2021-06-10 DIAGNOSIS — R350 Frequency of micturition: Secondary | ICD-10-CM | POA: Diagnosis not present

## 2021-06-10 DIAGNOSIS — N401 Enlarged prostate with lower urinary tract symptoms: Secondary | ICD-10-CM | POA: Diagnosis not present

## 2021-06-14 DIAGNOSIS — R972 Elevated prostate specific antigen [PSA]: Secondary | ICD-10-CM | POA: Diagnosis not present

## 2021-06-14 DIAGNOSIS — N200 Calculus of kidney: Secondary | ICD-10-CM | POA: Diagnosis not present

## 2021-06-14 DIAGNOSIS — N138 Other obstructive and reflux uropathy: Secondary | ICD-10-CM | POA: Diagnosis not present

## 2021-06-14 DIAGNOSIS — N401 Enlarged prostate with lower urinary tract symptoms: Secondary | ICD-10-CM | POA: Diagnosis not present

## 2021-06-20 DIAGNOSIS — I4891 Unspecified atrial fibrillation: Secondary | ICD-10-CM | POA: Diagnosis not present

## 2021-06-20 DIAGNOSIS — D510 Vitamin B12 deficiency anemia due to intrinsic factor deficiency: Secondary | ICD-10-CM | POA: Diagnosis not present

## 2021-07-04 DIAGNOSIS — D510 Vitamin B12 deficiency anemia due to intrinsic factor deficiency: Secondary | ICD-10-CM | POA: Diagnosis not present

## 2021-07-08 DIAGNOSIS — Z79899 Other long term (current) drug therapy: Secondary | ICD-10-CM | POA: Insufficient documentation

## 2021-07-08 DIAGNOSIS — G40209 Localization-related (focal) (partial) symptomatic epilepsy and epileptic syndromes with complex partial seizures, not intractable, without status epilepticus: Secondary | ICD-10-CM | POA: Diagnosis not present

## 2021-07-08 HISTORY — DX: Other long term (current) drug therapy: Z79.899

## 2021-07-20 DIAGNOSIS — D510 Vitamin B12 deficiency anemia due to intrinsic factor deficiency: Secondary | ICD-10-CM | POA: Diagnosis not present

## 2021-07-20 DIAGNOSIS — I4891 Unspecified atrial fibrillation: Secondary | ICD-10-CM | POA: Diagnosis not present

## 2021-08-17 DIAGNOSIS — Z20828 Contact with and (suspected) exposure to other viral communicable diseases: Secondary | ICD-10-CM | POA: Diagnosis not present

## 2021-08-17 DIAGNOSIS — J329 Chronic sinusitis, unspecified: Secondary | ICD-10-CM | POA: Diagnosis not present

## 2021-08-17 DIAGNOSIS — J40 Bronchitis, not specified as acute or chronic: Secondary | ICD-10-CM | POA: Diagnosis not present

## 2021-08-17 DIAGNOSIS — Z6826 Body mass index (BMI) 26.0-26.9, adult: Secondary | ICD-10-CM | POA: Diagnosis not present

## 2021-08-19 DIAGNOSIS — D510 Vitamin B12 deficiency anemia due to intrinsic factor deficiency: Secondary | ICD-10-CM | POA: Diagnosis not present

## 2021-08-19 DIAGNOSIS — I4891 Unspecified atrial fibrillation: Secondary | ICD-10-CM | POA: Diagnosis not present

## 2021-09-08 DIAGNOSIS — D510 Vitamin B12 deficiency anemia due to intrinsic factor deficiency: Secondary | ICD-10-CM | POA: Diagnosis not present

## 2021-09-19 DIAGNOSIS — H401134 Primary open-angle glaucoma, bilateral, indeterminate stage: Secondary | ICD-10-CM | POA: Diagnosis not present

## 2021-09-20 DIAGNOSIS — D510 Vitamin B12 deficiency anemia due to intrinsic factor deficiency: Secondary | ICD-10-CM | POA: Diagnosis not present

## 2021-09-20 DIAGNOSIS — I4891 Unspecified atrial fibrillation: Secondary | ICD-10-CM | POA: Diagnosis not present

## 2021-10-05 DIAGNOSIS — D510 Vitamin B12 deficiency anemia due to intrinsic factor deficiency: Secondary | ICD-10-CM | POA: Diagnosis not present

## 2021-10-07 DIAGNOSIS — H401131 Primary open-angle glaucoma, bilateral, mild stage: Secondary | ICD-10-CM | POA: Diagnosis not present

## 2021-10-07 DIAGNOSIS — Z961 Presence of intraocular lens: Secondary | ICD-10-CM | POA: Diagnosis not present

## 2021-10-17 DIAGNOSIS — H401131 Primary open-angle glaucoma, bilateral, mild stage: Secondary | ICD-10-CM | POA: Diagnosis not present

## 2021-10-24 DIAGNOSIS — D510 Vitamin B12 deficiency anemia due to intrinsic factor deficiency: Secondary | ICD-10-CM | POA: Diagnosis not present

## 2021-10-24 NOTE — Progress Notes (Unsigned)
[facility]  [address] [place],  [place]  [address] [phone]  Clinic Day:  10/31/2021  Referring physician: Angelina Sheriff, MD  This document serves as a record of services personally performed by Desirea Mizrahi Macarthur Critchley, MD. It was created on their behalf by [facility] E, a trained medical scribe. The creation of this record is based on the scribe's personal observations and the provider's statements to them.  HISTORY OF PRESENT ILLNESS:  The patient is a 76 y.o. male  who I began seeing for mild anemia.  Labs have shown no evidence of multiple myeloma, kidney disease or nutritional deficiencies being present. He comes in today for repeat clinical assessment.  Of note, he has a history of B12 deficiency, for which he receives monthly B12 injections at his primary care office.  Since his last visit, the patient has been doing very well.  He denies having increased fatigue or any overt forms of blood loss which concern him for progressive anemia.    PHYSICAL EXAM:  Blood pressure 124/70, pulse 82, temperature 98.1 F (36.7 C), resp. rate 16, height 5' 11"  (1.803 m), weight 181 lb 1.6 oz (82.1 kg), SpO2 97 %. Wt Readings from Last 3 Encounters:  10/31/21 181 lb 1.6 oz (82.1 kg)  05/02/21 184 lb 3.2 oz (83.6 kg)  03/31/21 184 lb (83.5 kg)   Body mass index is 25.26 kg/m. Performance status (ECOG): 1 - Symptomatic but completely ambulatory Physical Exam Constitutional:      General: He is not in acute distress.    Appearance: Normal appearance. He is normal weight.  HENT:     Head: Normocephalic and atraumatic.  Eyes:     General: No scleral icterus.    Extraocular Movements: Extraocular movements intact.     Conjunctiva/sclera: Conjunctivae normal.     Pupils: Pupils are equal, round, and reactive to light.  Cardiovascular:     Rate and Rhythm: Normal rate and regular rhythm.     Pulses: Normal pulses.     Heart sounds: Normal heart  sounds. No murmur heard.   No friction rub. No gallop.  Pulmonary:     Effort: Pulmonary effort is normal. No respiratory distress.     Breath sounds: Normal breath sounds.  Abdominal:     General: Bowel sounds are normal. There is no distension.     Palpations: Abdomen is soft. There is no hepatomegaly, splenomegaly or mass.     Tenderness: There is no abdominal tenderness.  Musculoskeletal:        General: Normal range of motion.     Cervical back: Normal range of motion and neck supple.     Right lower leg: No edema.     Left lower leg: No edema.  Lymphadenopathy:     Cervical: No cervical adenopathy.  Skin:    General: Skin is warm and dry.  Neurological:     General: No focal deficit present.     Mental Status: He is alert and oriented to person, place, and time. Mental status is at baseline.  Psychiatric:        Mood and Affect: Mood normal.        Behavior: Behavior normal.        Thought Content: Thought content normal.        Judgment: Judgment normal.   . LABS:   CBC Latest Ref Rng & Units 10/31/2021 05/02/2021 03/01/2021  WBC - 6.0 5.8 6.8  Hemoglobin 13.5 -  17.5 12.0(A) 12.5(A) 12.9(A)  Hematocrit 41 - 53 36(A) 37(A) 38(A)  Platelets 150 - 400 K/uL 179 166 179   CMP Latest Ref Rng & Units 10/31/2021 03/01/2021 09/16/2020  Glucose 65 - 99 mg/dL - - 118(H)  BUN 4 - 21 21 18 17   Creatinine 0.6 - 1.3 0.9 0.9 0.94  Sodium 137 - 147 141 141 144  Potassium 3.5 - 5.1 mEq/L 4.2 4.1 4.3  Chloride 99 - 108 112(A) 110(A) 108(H)  CO2 13 - 22 18 18 22   Calcium 8.7 - 10.7 8.5(A) 8.8 8.8  Total Protein 6.5 - 8.1 g/dL - - -  Total Bilirubin 0.3 - 1.2 mg/dL - - -  Alkaline Phos 25 - 125 91 93 -  AST 14 - 40 19 25 -  ALT 10 - 40 U/L 16 14 -   Iron b12 folate pending  ASSESSMENT & PLAN:  A 76 y.o. male with mild anemia.  His hemoglobin of 12.0 today is lower than previously.  However, it is not dangerously low.  Labs today suggest   As he is clinically doing well, I will see  him back in 6 months for repeat clinical assessment.  The patient understands all the plans discussed today and is in agreement with them.  I, Rita Ohara, am acting as scribe for Marice Potter, MD    I have reviewed this report as typed by the medical scribe, and it is complete and accurate.  Edward Mosqueda Macarthur Critchley, MD

## 2021-10-31 ENCOUNTER — Other Ambulatory Visit: Payer: Self-pay

## 2021-10-31 ENCOUNTER — Other Ambulatory Visit: Payer: Self-pay | Admitting: Hematology and Oncology

## 2021-10-31 ENCOUNTER — Telehealth: Payer: Self-pay

## 2021-10-31 ENCOUNTER — Inpatient Hospital Stay: Payer: Medicare Other | Attending: Oncology | Admitting: Oncology

## 2021-10-31 ENCOUNTER — Inpatient Hospital Stay: Payer: Medicare Other

## 2021-10-31 ENCOUNTER — Other Ambulatory Visit: Payer: Self-pay | Admitting: Oncology

## 2021-10-31 VITALS — BP 124/70 | HR 82 | Temp 98.1°F | Resp 16 | Ht 71.0 in | Wt 181.1 lb

## 2021-10-31 DIAGNOSIS — D649 Anemia, unspecified: Secondary | ICD-10-CM

## 2021-10-31 LAB — CBC AND DIFFERENTIAL
HCT: 36 — AB (ref 41–53)
Hemoglobin: 12 — AB (ref 13.5–17.5)
Neutrophils Absolute: 3.84
Platelets: 179 10*3/uL (ref 150–400)
WBC: 6

## 2021-10-31 LAB — BASIC METABOLIC PANEL
BUN: 21 (ref 4–21)
CO2: 18 (ref 13–22)
Chloride: 112 — AB (ref 99–108)
Creatinine: 0.9 (ref 0.6–1.3)
Glucose: 93
Potassium: 4.2 mEq/L (ref 3.5–5.1)
Sodium: 141 (ref 137–147)

## 2021-10-31 LAB — HEPATIC FUNCTION PANEL
ALT: 16 U/L (ref 10–40)
AST: 19 (ref 14–40)
Alkaline Phosphatase: 91 (ref 25–125)
Bilirubin, Total: 0.3

## 2021-10-31 LAB — IRON AND TIBC
Iron: 86 ug/dL (ref 45–182)
Saturation Ratios: 38 % (ref 17.9–39.5)
TIBC: 227 ug/dL — ABNORMAL LOW (ref 250–450)
UIBC: 141 ug/dL

## 2021-10-31 LAB — COMPREHENSIVE METABOLIC PANEL
Albumin: 4.1 (ref 3.5–5.0)
Calcium: 8.5 — AB (ref 8.7–10.7)

## 2021-10-31 LAB — VITAMIN B12: Vitamin B-12: 330 pg/mL (ref 180–914)

## 2021-10-31 LAB — FERRITIN: Ferritin: 180 ng/mL (ref 24–336)

## 2021-10-31 LAB — CBC: RBC: 3.9 (ref 3.87–5.11)

## 2021-10-31 LAB — FOLATE: Folate: 7.4 ng/mL (ref >5.9)

## 2021-10-31 NOTE — Telephone Encounter (Signed)
Dr Bobby Rumpf reviewed labs below. He states tell pt that they are all fine. Pt notified and verbalized understanding. ? ? Latest Reference Range & Units 10/31/21 10:57  ?Iron 45 - 182 ug/dL 86  ?UIBC ug/dL 141  ?TIBC 250 - 450 ug/dL 227 (L)  ?Saturation Ratios 17.9 - 39.5 % 38  ?Ferritin 24 - 336 ng/mL 180  ?Folate >5.9 ng/mL 7.4  ?Vitamin B12 180 - 914 pg/mL 330  ?(L): Data is abnormally low ?

## 2021-10-31 NOTE — Progress Notes (Incomplete)
?[facility]  ?[address] ?[place],    [address] ?[phone] ? ?Clinic Day:  10/31/2021 ? ?Referring physician: No ref. provider found ? ?This document serves as a record of services personally performed by Marice Potter, MD. It was created on their behalf by Curry,Lauren E, a trained medical scribe. The creation of this record is based on the scribe's personal observations and the provider's statements to them. ? ?HISTORY OF PRESENT ILLNESS:  ?The patient is a 76 y.o. male  who I recently began seeing for mild anemia.  He comes in today to go over all of his recent labs to determine the etiology behind this.  Of note, he has a history of B12 deficiency, for which he receives monthly B12 injections at his primary care office.  Since his last visit, the patient has been doing well.  He denies having increased fatigue or any overt forms of blood loss which concern him for progressive anemia.   ? ?PHYSICAL EXAM:  ?There were no vitals taken for this visit. ?Wt Readings from Last 3 Encounters:  ?05/02/21 184 lb 3.2 oz (83.6 kg)  ?03/31/21 184 lb (83.5 kg)  ?03/01/21 186 lb 3.2 oz (84.5 kg)  ? ?There is no height or weight on file to calculate BMI. ?Performance status (ECOG): 1 - Symptomatic but completely ambulatory ?Physical Exam ?Constitutional:   ?   General: He is not in acute distress. ?   Appearance: Normal appearance. He is normal weight.  ?HENT:  ?   Head: Normocephalic and atraumatic.  ?Eyes:  ?   General: No scleral icterus. ?   Extraocular Movements: Extraocular movements intact.  ?   Conjunctiva/sclera: Conjunctivae normal.  ?   Pupils: Pupils are equal, round, and reactive to light.  ?Cardiovascular:  ?   Rate and Rhythm: Normal rate and regular rhythm.  ?   Pulses: Normal pulses.  ?   Heart sounds: Normal heart sounds. No murmur heard. ?  No friction rub. No gallop.  ?Pulmonary:  ?   Effort: Pulmonary effort is normal. No respiratory distress.  ?   Breath  sounds: Normal breath sounds.  ?Abdominal:  ?   General: Bowel sounds are normal. There is no distension.  ?   Palpations: Abdomen is soft. There is no hepatomegaly, splenomegaly or mass.  ?   Tenderness: There is no abdominal tenderness.  ?Musculoskeletal:     ?   General: Normal range of motion.  ?   Cervical back: Normal range of motion and neck supple.  ?   Right lower leg: No edema.  ?   Left lower leg: No edema.  ?Lymphadenopathy:  ?   Cervical: No cervical adenopathy.  ?Skin: ?   General: Skin is warm and dry.  ?Neurological:  ?   General: No focal deficit present.  ?   Mental Status: He is alert and oriented to person, place, and time. Mental status is at baseline.  ?Psychiatric:     ?   Mood and Affect: Mood normal.     ?   Behavior: Behavior normal.     ?   Thought Content: Thought content normal.     ?   Judgment: Judgment normal.  ? ?. ?LABS:  ? ?CBC Latest Ref Rng & Units 05/02/2021 03/01/2021 05/08/2018  ?WBC - 5.8 6.8 6.1  ?Hemoglobin 13.5 - 17.5 12.5(A) 12.9(A) 12.2(L)  ?Hematocrit 41 - 53 37(A) 38(A) 37.1(L)  ?Platelets 150 - 399 166 179 143(L)  ? ?  CMP Latest Ref Rng & Units 03/01/2021 09/16/2020 05/08/2018  ?Glucose 65 - 99 mg/dL - 118(H) 98  ?BUN 4 - 21 18 17 15   ?Creatinine 0.6 - 1.3 0.9 0.94 1.05  ?Sodium 137 - 147 141 144 139  ?Potassium 3.4 - 5.3 4.1 4.3 4.4  ?Chloride 99 - 108 110(A) 108(H) 108  ?CO2 13 - 22 18 22 23   ?Calcium 8.7 - 10.7 8.8 8.8 8.9  ?Total Protein 6.5 - 8.1 g/dL - - 6.6  ?Total Bilirubin 0.3 - 1.2 mg/dL - - 0.4  ?Alkaline Phos 25 - 125 93 - 88  ?AST 14 - 40 25 - 15  ?ALT 10 - 40 14 - 11  ? ?Total Protein ELP 6.0 - 8.5 g/dL 6.8   ?Albumin ELP 2.9 - 4.4 g/dL 4.0   ?Alpha-1-Globulin 0.0 - 0.4 g/dL 0.2   ?Alpha-2-Globulin 0.4 - 1.0 g/dL 0.7   ?Beta Globulin 0.7 - 1.3 g/dL 0.9   ?Gamma Globulin 0.4 - 1.8 g/dL 1.0   ?M-Spike, % Not Observed g/dL Not Observed   ? ?Globulin, Total 2.2 - 3.9 g/dL 2.8 VC   ?A/G Ratio 0.7 - 1.7 1.4 VC   ? ? Ref. Range 05/02/2021 10:19 05/02/2021 10:37  ?Iron  Latest Ref Range: 45 - 182 ug/dL 87   ?UIBC Latest Units: ug/dL 141   ?TIBC Latest Ref Range: 250 - 450 ug/dL 228 (L)   ?Saturation Ratios Latest Ref Range: 17.9 - 39.5 % 38   ?Ferritin Latest Ref Range: 24 - 336 ng/mL  218  ?Folate Latest Ref Range: >5.9 ng/mL 14.0   ?Vitamin B12 Latest Ref Range: 180 - 914 pg/mL 238   ? ? ?ASSESSMENT & PLAN:  ?A 76 y.o. male with mild anemia.  His hemoglobin of 12.5 today is not dangerously low.  Labs show no evidence of multiple myeloma, kidney disease or nutritional deficiencies being present.  As long as his hemoglobin remains above 11-12, his anemia will continue to be followed conservatively.  He knows to continue taking his monthly B12 injections to ensure adequate fortification of his cobalamin stores.   As he is doing well, I will see him back in 6 months for repeat clinical assessment.  The patient understands all the plans discussed today and is in agreement with them. ? ?I, Rita Ohara, am acting as scribe for Marice Potter, MD   ? ?I have reviewed this report as typed by the medical scribe, and it is complete and accurate. ? ?Klea Nall Macarthur Critchley, MD ? ? ? ?  ? ?

## 2021-11-16 DIAGNOSIS — R051 Acute cough: Secondary | ICD-10-CM | POA: Diagnosis not present

## 2021-11-16 DIAGNOSIS — R0981 Nasal congestion: Secondary | ICD-10-CM | POA: Diagnosis not present

## 2021-11-16 DIAGNOSIS — J069 Acute upper respiratory infection, unspecified: Secondary | ICD-10-CM | POA: Diagnosis not present

## 2021-11-18 DIAGNOSIS — I4891 Unspecified atrial fibrillation: Secondary | ICD-10-CM | POA: Diagnosis not present

## 2021-11-18 DIAGNOSIS — D510 Vitamin B12 deficiency anemia due to intrinsic factor deficiency: Secondary | ICD-10-CM | POA: Diagnosis not present

## 2021-11-21 DIAGNOSIS — D510 Vitamin B12 deficiency anemia due to intrinsic factor deficiency: Secondary | ICD-10-CM | POA: Diagnosis not present

## 2021-12-02 DIAGNOSIS — H401131 Primary open-angle glaucoma, bilateral, mild stage: Secondary | ICD-10-CM | POA: Diagnosis not present

## 2021-12-05 DIAGNOSIS — L821 Other seborrheic keratosis: Secondary | ICD-10-CM | POA: Diagnosis not present

## 2021-12-05 DIAGNOSIS — L578 Other skin changes due to chronic exposure to nonionizing radiation: Secondary | ICD-10-CM | POA: Diagnosis not present

## 2021-12-08 ENCOUNTER — Other Ambulatory Visit: Payer: Self-pay | Admitting: Cardiology

## 2021-12-08 NOTE — Telephone Encounter (Signed)
Prescription refill request for Xarelto received.  ?Indication:Afib ?Last office visit:8/22 ?Weight:82.1 kg ?Age:76 ?Scr:0.9 ?CrCl:81.09  ml/min ? ?Prescription refilled ? ?

## 2021-12-12 DIAGNOSIS — H401111 Primary open-angle glaucoma, right eye, mild stage: Secondary | ICD-10-CM | POA: Diagnosis not present

## 2021-12-18 DIAGNOSIS — I4891 Unspecified atrial fibrillation: Secondary | ICD-10-CM | POA: Diagnosis not present

## 2021-12-18 DIAGNOSIS — D510 Vitamin B12 deficiency anemia due to intrinsic factor deficiency: Secondary | ICD-10-CM | POA: Diagnosis not present

## 2021-12-30 DIAGNOSIS — D510 Vitamin B12 deficiency anemia due to intrinsic factor deficiency: Secondary | ICD-10-CM | POA: Diagnosis not present

## 2022-01-23 DIAGNOSIS — D510 Vitamin B12 deficiency anemia due to intrinsic factor deficiency: Secondary | ICD-10-CM | POA: Diagnosis not present

## 2022-01-25 DIAGNOSIS — H401131 Primary open-angle glaucoma, bilateral, mild stage: Secondary | ICD-10-CM | POA: Diagnosis not present

## 2022-01-25 DIAGNOSIS — Z961 Presence of intraocular lens: Secondary | ICD-10-CM | POA: Diagnosis not present

## 2022-02-20 DIAGNOSIS — D510 Vitamin B12 deficiency anemia due to intrinsic factor deficiency: Secondary | ICD-10-CM | POA: Diagnosis not present

## 2022-03-20 DIAGNOSIS — I4891 Unspecified atrial fibrillation: Secondary | ICD-10-CM | POA: Diagnosis not present

## 2022-03-20 DIAGNOSIS — E039 Hypothyroidism, unspecified: Secondary | ICD-10-CM | POA: Diagnosis not present

## 2022-03-23 DIAGNOSIS — D510 Vitamin B12 deficiency anemia due to intrinsic factor deficiency: Secondary | ICD-10-CM | POA: Diagnosis not present

## 2022-03-30 DIAGNOSIS — R011 Cardiac murmur, unspecified: Secondary | ICD-10-CM | POA: Insufficient documentation

## 2022-03-30 DIAGNOSIS — N200 Calculus of kidney: Secondary | ICD-10-CM | POA: Insufficient documentation

## 2022-03-30 DIAGNOSIS — I4891 Unspecified atrial fibrillation: Secondary | ICD-10-CM | POA: Insufficient documentation

## 2022-03-31 ENCOUNTER — Encounter: Payer: Self-pay | Admitting: Cardiology

## 2022-03-31 ENCOUNTER — Ambulatory Visit (INDEPENDENT_AMBULATORY_CARE_PROVIDER_SITE_OTHER): Payer: Medicare Other | Admitting: Cardiology

## 2022-03-31 VITALS — BP 130/70 | HR 78 | Ht 71.0 in | Wt 183.2 lb

## 2022-03-31 DIAGNOSIS — Z7901 Long term (current) use of anticoagulants: Secondary | ICD-10-CM

## 2022-03-31 DIAGNOSIS — E782 Mixed hyperlipidemia: Secondary | ICD-10-CM | POA: Insufficient documentation

## 2022-03-31 DIAGNOSIS — E785 Hyperlipidemia, unspecified: Secondary | ICD-10-CM | POA: Diagnosis not present

## 2022-03-31 DIAGNOSIS — I480 Paroxysmal atrial fibrillation: Secondary | ICD-10-CM

## 2022-03-31 DIAGNOSIS — I6300 Cerebral infarction due to thrombosis of unspecified precerebral artery: Secondary | ICD-10-CM

## 2022-03-31 NOTE — Progress Notes (Signed)
Cardiology Office Note:    Date:  03/31/2022   ID:  [name], DOB [birth year], MRN [id number]  PCP:  Angelina Sheriff, MD  Cardiologist:  Jenean Lindau, MD   Referring MD: Angelina Sheriff, MD    ASSESSMENT:    1. Paroxysmal atrial fibrillation (HCC)   2. Cerebrovascular accident (CVA) due to thrombosis of precerebral artery ([place])   3. Current use of long term anticoagulation   4. Dyslipidemia   5. Mixed dyslipidemia    PLAN:    In order of problems listed above:  Primary prevention stressed with the patient.  Importance of compliance with diet medication stressed and he vocalized understanding.  He walks half an hour a day on a daily basis 5 days a week and I congratulated him about this. Paroxysmal atrial fibrillation:I discussed with the patient atrial fibrillation, disease process. Management and therapy including rate and rhythm control, anticoagulation benefits and potential risks were discussed extensively with the patient. Patient had multiple questions which were answered to patient's satisfaction. History of stroke: On appropriate medications.  Has recovered. Mixed dyslipidemia: On lipid-lowering medications.  Followed by primary care.  Diet emphasized. Patient will be seen in follow-up appointment in 6 months or earlier if the patient has any concerns   Medication Adjustments/Labs and Tests Ordered: Current medicines are reviewed at length with the patient today.  Concerns regarding medicines are outlined above.  Orders Placed This Encounter  Procedures   EKG 12-Lead   No orders of the defined types were placed in this encounter.    No chief complaint on file.    History of Present Illness:    Edward Boone is a 76 y.o. male.  Patient is previously unknown to me and seen my my partner who has moved to [place].  Patient has paroxysmal atrial fibrillation.  He denies any problems at this time and takes care of activities of daily living.  He  walks half a day 5 days a week without any problems.  He has had history of stroke.  At the time of my evaluation, the patient is alert awake oriented and in no distress.  Past Medical History:  Diagnosis Date   Acute loss of vision, right 05/08/2018   Anemia 03/01/2021   Atrial fibrillation (HCC)    BPH (benign prostatic hyperplasia)    BPH with obstruction/lower urinary tract symptoms 04/15/2014   Cerebrovascular accident (CVA) ([place]) 05/24/2018   Current use of long term anticoagulation 10/07/2019   Dyslipidemia    Elevated PSA    Epilepsy ([place])    GERD (gastroesophageal reflux disease)    Heart murmur    Hypothyroidism (acquired)    Kidney stones    Long term use of drug 07/08/2021   Nephrolithiasis 06/01/2020   Paroxysmal atrial fibrillation ([place]) 09/30/2018   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   dutasteride (AVODART) 0.5 MG capsule    famotidine (PEPCID) 40 MG tablet Take 40 mg by mouth.   fluticasone (FLONASE) 50 MCG/ACT nasal spray Place 2 sprays into both nostrils as needed  for allergies or rhinitis.   levothyroxine (SYNTHROID, LEVOTHROID) 25 MCG tablet Take 25 mcg by mouth daily before breakfast.    PHENObarbital (LUMINAL) 30 MG tablet Take 30-60 mg by mouth 2 (two) times daily. 30 mg qAM, 60 mg qhs   phenytoin (DILANTIN) 100 MG ER capsule Take 100 mg by mouth every morning. And takes 200 mg at night.   rivaroxaban (XARELTO) 20 MG TABS tablet TAKE ONE TABLET BY MOUTH EVERY MORNING   rosuvastatin (CRESTOR) 20 MG tablet Take 1 tablet by mouth at bedtime.   vitamin B-12 (CYANOCOBALAMIN) 1000 MCG  tablet Take 1,000 mcg by mouth daily.     Allergies:   Patient has no known allergies.   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  Tobacco Use   Smoking status: Never   Smokeless tobacco: Never  Substance and Sexual Activity   Alcohol use: No   Drug use: No   Sexual activity: Yes  Other Topics Concern   Not on file  Social History Narrative   Not on file   Social Determinants of Health   Financial Resource Strain: Not on file  Food Insecurity: Not on file  Transportation Needs: Not on file  Physical Activity: Not on file  Stress: Not on file  Social Connections: Not on file     Family History: The patient's family history includes Asthma in his sister; Atrial fibrillation in his father; Breast cancer in his maternal aunt; CVA (age of onset: 82) in his father; Heart attack in his father; Throat cancer in his maternal grandfather.  ROS:   Please see the history of present illness.    All other systems reviewed and are negative.  EKGs/Labs/Other Studies Reviewed:    The following studies were reviewed today: EKG reveals sinus rhythm and nonspecific ST-T changes   Recent Labs: 10/31/2021: ALT 16; BUN 21; Creatinine 0.9; Hemoglobin 12.0; Platelets 179; Potassium 4.2; Sodium 141  Recent Lipid Panel    Component Value Date/Time   CHOL 138 05/08/2018 0621   TRIG 76 05/08/2018 0621   HDL 36 (L) 05/08/2018 0621   CHOLHDL 3.8 05/08/2018 0621   VLDL 15 05/08/2018 0621   LDLCALC 87 05/08/2018 0621    Physical Exam:    VS:  BP 130/70   Pulse 78   Ht 5' 11"  (1.803 m)   Wt 183 lb 3.2 oz (83.1 kg)   SpO2 97%   BMI 25.55 kg/m     Wt Readings from Last 3 Encounters:  03/31/22 183 lb 3.2 oz (83.1 kg)  10/31/21 181 lb 1.6 oz (82.1 kg)  05/02/21 184 lb 3.2 oz  (83.6 kg)     GEN: Patient is in no acute distress HEENT: Normal NECK: No JVD; No carotid bruits LYMPHATICS: No lymphadenopathy CARDIAC: Hear sounds regular, 2/6 systolic murmur at the apex. RESPIRATORY:  Clear to auscultation without rales, wheezing or rhonchi  ABDOMEN: Soft, non-tender, non-distended MUSCULOSKELETAL:  No edema; No deformity  SKIN: Warm and dry NEUROLOGIC:  Alert and oriented x 3 PSYCHIATRIC:  Normal affect   Signed, Jenean Lindau, MD  03/31/2022 2:46 PM    [place]

## 2022-03-31 NOTE — Patient Instructions (Signed)

## 2022-04-13 DIAGNOSIS — R21 Rash and other nonspecific skin eruption: Secondary | ICD-10-CM | POA: Diagnosis not present

## 2022-04-20 DIAGNOSIS — Z6826 Body mass index (BMI) 26.0-26.9, adult: Secondary | ICD-10-CM | POA: Diagnosis not present

## 2022-04-20 DIAGNOSIS — D510 Vitamin B12 deficiency anemia due to intrinsic factor deficiency: Secondary | ICD-10-CM | POA: Diagnosis not present

## 2022-04-20 DIAGNOSIS — Z1331 Encounter for screening for depression: Secondary | ICD-10-CM | POA: Diagnosis not present

## 2022-04-20 DIAGNOSIS — E039 Hypothyroidism, unspecified: Secondary | ICD-10-CM | POA: Diagnosis not present

## 2022-04-20 DIAGNOSIS — I4891 Unspecified atrial fibrillation: Secondary | ICD-10-CM | POA: Diagnosis not present

## 2022-04-20 DIAGNOSIS — Z0000 Encounter for general adult medical examination without abnormal findings: Secondary | ICD-10-CM | POA: Diagnosis not present

## 2022-04-20 DIAGNOSIS — E7800 Pure hypercholesterolemia, unspecified: Secondary | ICD-10-CM | POA: Diagnosis not present

## 2022-04-25 DIAGNOSIS — D519 Vitamin B12 deficiency anemia, unspecified: Secondary | ICD-10-CM | POA: Diagnosis not present

## 2022-05-02 NOTE — Progress Notes (Signed)
[facility]  [address] [place],  [place]  [address] [phone]  Clinic Day:  05/03/2022  Referring physician: Angelina Sheriff, MD  HISTORY OF PRESENT ILLNESS:  The patient is a 76 y.o. male  who I began seeing for mild anemia.  Labs have shown no evidence of multiple myeloma, kidney disease or nutritional deficiencies being present. He comes in today for repeat clinical assessment.  Of note, he has a history of B12 deficiency, for which he receives monthly B12 injections at his primary care office.  Since his last visit, the patient has been doing very well.  He denies having increased fatigue or any overt forms of blood loss which concern him for progressive anemia.    PHYSICAL EXAM:  Blood pressure (!) 140/67, pulse 89, temperature 98.3 F (36.8 C), resp. rate 18, height _0  (1.803 m), weight 183 lb (83 kg), SpO2 96 %. Wt Readings from Last 3 Encounters:  05/03/22 183 lb (83 kg)  03/31/22 183 lb 3.2 oz (83.1 kg)  10/31/21 181 lb 1.6 oz (82.1 kg)   Body mass index is 25.52 kg/m. Performance status (ECOG): 1 - Symptomatic but completely ambulatory Physical Exam Constitutional:      General: He is not in acute distress.    Appearance: Normal appearance. He is normal weight.  HENT:     Head: Normocephalic and atraumatic.  Eyes:     General: No scleral icterus.    Extraocular Movements: Extraocular movements intact.     Conjunctiva/sclera: Conjunctivae normal.     Pupils: Pupils are equal, round, and reactive to light.  Cardiovascular:     Rate and Rhythm: Normal rate and regular rhythm.     Pulses: Normal pulses.     Heart sounds: Normal heart sounds. No murmur heard.    No friction rub. No gallop.  Pulmonary:     Effort: Pulmonary effort is normal. No respiratory distress.     Breath sounds: Normal breath sounds.  Abdominal:     General: Bowel sounds are normal. There is no distension.     Palpations: Abdomen is soft. There is  no hepatomegaly, splenomegaly or mass.     Tenderness: There is no abdominal tenderness.  Musculoskeletal:        General: Normal range of motion.     Cervical back: Normal range of motion and neck supple.     Right lower leg: No edema.     Left lower leg: No edema.  Lymphadenopathy:     Cervical: No cervical adenopathy.  Skin:    General: Skin is warm and dry.  Neurological:     General: No focal deficit present.     Mental Status: He is alert and oriented to person, place, and time. Mental status is at baseline.  Psychiatric:        Mood and Affect: Mood normal.        Behavior: Behavior normal.        Thought Content: Thought content normal.        Judgment: Judgment normal.    . LABS:      Latest Ref Rng & Units 05/03/2022   12:00 AM 10/31/2021   12:00 AM 05/02/2021   12:00 AM  CBC  WBC  6.5     6.0     5.8      Hemoglobin 13.5 - 17.5 13.0     12.0     12.5      Hematocrit  41 - 53 38     36     37      Platelets 150 - 400 K/uL 205     179     166         This result is from an external source.      Latest Ref Rng & Units 10/31/2021   12:00 AM 03/01/2021   12:00 AM 09/16/2020    2:02 PM  CMP  Glucose 65 - 99 mg/dL   118   BUN 4 - _0 Creatinine 0.6 - 1.3 0.9     0.9     0.94   Sodium 137 - 147 141     141     144   Potassium 3.5 - 5.1 mEq/L 4.2     4.1     4.3   Chloride 99 - 108 112     110     108   CO2 13 - _1 Calcium 8.7 - 10.7 8.5     8.8     8.8   Alkaline Phos 25 - 125 91     93       AST 14 - 40 19     25       ALT 10 - 40 U/L 16     14          This result is from an external source.   ASSESSMENT & PLAN:  A 76 y.o. male with mild anemia.  I am pleased that the gentleman's hemoglobin has improved to 13 without any particular intervention.  This level is not much different than what it has been over the past numerous years.  Clinically, the patient appears to be doing well.  As that is the case, I will see him back in  1 year for repeat clinical assessment. The patient understands all the plans discussed today and is in agreement with them.  Ary Rudnick Macarthur Critchley, MD

## 2022-05-03 ENCOUNTER — Inpatient Hospital Stay: Payer: Medicare Other | Attending: Oncology | Admitting: Oncology

## 2022-05-03 ENCOUNTER — Telehealth: Payer: Self-pay | Admitting: Oncology

## 2022-05-03 ENCOUNTER — Other Ambulatory Visit: Payer: Self-pay | Admitting: Oncology

## 2022-05-03 ENCOUNTER — Inpatient Hospital Stay: Payer: Medicare Other

## 2022-05-03 VITALS — BP 140/67 | HR 89 | Temp 98.3°F | Resp 18 | Ht 71.0 in | Wt 183.0 lb

## 2022-05-03 DIAGNOSIS — D649 Anemia, unspecified: Secondary | ICD-10-CM

## 2022-05-03 LAB — CBC AND DIFFERENTIAL
HCT: 38 — AB (ref 41–53)
Hemoglobin: 13 — AB (ref 13.5–17.5)
Neutrophils Absolute: 4.23
Platelets: 205 10*3/uL (ref 150–400)
WBC: 6.5

## 2022-05-03 LAB — CBC: RBC: 4.09 (ref 3.87–5.11)

## 2022-05-03 NOTE — Telephone Encounter (Signed)
Patient has been scheduled for follow-up visit per 05/03/22 los. Pt given an appt calendar with date and time.  

## 2022-05-20 DIAGNOSIS — I4891 Unspecified atrial fibrillation: Secondary | ICD-10-CM | POA: Diagnosis not present

## 2022-05-20 DIAGNOSIS — E039 Hypothyroidism, unspecified: Secondary | ICD-10-CM | POA: Diagnosis not present

## 2022-05-22 DIAGNOSIS — Z23 Encounter for immunization: Secondary | ICD-10-CM | POA: Diagnosis not present

## 2022-05-26 DIAGNOSIS — K591 Functional diarrhea: Secondary | ICD-10-CM | POA: Diagnosis not present

## 2022-05-26 DIAGNOSIS — K6289 Other specified diseases of anus and rectum: Secondary | ICD-10-CM | POA: Diagnosis not present

## 2022-06-22 DIAGNOSIS — D519 Vitamin B12 deficiency anemia, unspecified: Secondary | ICD-10-CM | POA: Diagnosis not present

## 2022-07-10 DIAGNOSIS — G40209 Localization-related (focal) (partial) symptomatic epilepsy and epileptic syndromes with complex partial seizures, not intractable, without status epilepticus: Secondary | ICD-10-CM | POA: Diagnosis not present

## 2022-07-10 DIAGNOSIS — I639 Cerebral infarction, unspecified: Secondary | ICD-10-CM | POA: Diagnosis not present

## 2022-07-12 DIAGNOSIS — H33001 Unspecified retinal detachment with retinal break, right eye: Secondary | ICD-10-CM | POA: Diagnosis not present

## 2022-07-12 DIAGNOSIS — H401131 Primary open-angle glaucoma, bilateral, mild stage: Secondary | ICD-10-CM | POA: Diagnosis not present

## 2022-07-12 DIAGNOSIS — T8522XA Displacement of intraocular lens, initial encounter: Secondary | ICD-10-CM | POA: Diagnosis not present

## 2022-07-20 DIAGNOSIS — H33001 Unspecified retinal detachment with retinal break, right eye: Secondary | ICD-10-CM | POA: Diagnosis not present

## 2022-07-31 DIAGNOSIS — D510 Vitamin B12 deficiency anemia due to intrinsic factor deficiency: Secondary | ICD-10-CM | POA: Diagnosis not present

## 2022-08-17 DIAGNOSIS — H3341 Traction detachment of retina, right eye: Secondary | ICD-10-CM | POA: Diagnosis not present

## 2022-08-22 DIAGNOSIS — H3341 Traction detachment of retina, right eye: Secondary | ICD-10-CM | POA: Diagnosis not present

## 2022-08-22 DIAGNOSIS — H33001 Unspecified retinal detachment with retinal break, right eye: Secondary | ICD-10-CM | POA: Diagnosis not present

## 2022-08-30 DIAGNOSIS — D519 Vitamin B12 deficiency anemia, unspecified: Secondary | ICD-10-CM | POA: Diagnosis not present

## 2022-08-31 DIAGNOSIS — H3341 Traction detachment of retina, right eye: Secondary | ICD-10-CM | POA: Diagnosis not present

## 2022-09-28 DIAGNOSIS — H3341 Traction detachment of retina, right eye: Secondary | ICD-10-CM | POA: Diagnosis not present

## 2022-11-29 DIAGNOSIS — N400 Enlarged prostate without lower urinary tract symptoms: Secondary | ICD-10-CM | POA: Diagnosis not present

## 2022-11-29 DIAGNOSIS — Z79899 Other long term (current) drug therapy: Secondary | ICD-10-CM | POA: Diagnosis not present

## 2022-11-29 DIAGNOSIS — G459 Transient cerebral ischemic attack, unspecified: Secondary | ICD-10-CM | POA: Diagnosis not present

## 2022-11-29 DIAGNOSIS — Z636 Dependent relative needing care at home: Secondary | ICD-10-CM | POA: Diagnosis not present

## 2022-11-29 DIAGNOSIS — G40909 Epilepsy, unspecified, not intractable, without status epilepticus: Secondary | ICD-10-CM | POA: Diagnosis not present

## 2022-11-29 DIAGNOSIS — E039 Hypothyroidism, unspecified: Secondary | ICD-10-CM | POA: Diagnosis not present

## 2022-11-29 DIAGNOSIS — I482 Chronic atrial fibrillation, unspecified: Secondary | ICD-10-CM | POA: Diagnosis not present

## 2022-11-29 DIAGNOSIS — E782 Mixed hyperlipidemia: Secondary | ICD-10-CM | POA: Diagnosis not present

## 2022-11-29 DIAGNOSIS — N200 Calculus of kidney: Secondary | ICD-10-CM | POA: Diagnosis not present

## 2022-11-29 DIAGNOSIS — K219 Gastro-esophageal reflux disease without esophagitis: Secondary | ICD-10-CM | POA: Diagnosis not present

## 2022-11-29 DIAGNOSIS — Z131 Encounter for screening for diabetes mellitus: Secondary | ICD-10-CM | POA: Diagnosis not present

## 2022-12-11 ENCOUNTER — Other Ambulatory Visit: Payer: Self-pay

## 2022-12-11 MED ORDER — RIVAROXABAN 20 MG PO TABS: 20.0000 mg | ORAL_TABLET | Freq: Every morning | ORAL | 1 refills | Status: AC

## 2022-12-11 NOTE — Telephone Encounter (Addendum)
Pt last saw Dr Tomie China 03/31/22, last labs 04/20/22 Creat 1.1 at [facility] per [place], age 77, weight 83kg, CrCl 66.02, based on CrCl pt is on appropriate dosage of Xarelto  QD.  Will refill rx.  Please call pt to notify Xarelto rx has been sent to requested pharmacy.  Thanks Pt called aware rx sent to pharmacy.

## 2022-12-11 NOTE — Telephone Encounter (Signed)
Pt has changed pharmacy and need medication sent to new pharmacy in [place]. Pt would like a call back when this is done. Please address

## 2022-12-14 DIAGNOSIS — H33311 Horseshoe tear of retina without detachment, right eye: Secondary | ICD-10-CM | POA: Diagnosis not present

## 2022-12-14 DIAGNOSIS — H43812 Vitreous degeneration, left eye: Secondary | ICD-10-CM | POA: Diagnosis not present

## 2022-12-15 ENCOUNTER — Encounter: Payer: Self-pay | Admitting: Cardiology

## 2022-12-15 ENCOUNTER — Telehealth: Payer: Self-pay | Admitting: *Deleted

## 2022-12-15 NOTE — Telephone Encounter (Signed)
Patient with diagnosis of afib on Xarelto for anticoagulation.    Procedure: Pars Plana Vitrectomy, silicone oil removal  Date of procedure: TBD  CHA2DS2-VASc Score = 4  This indicates a 4.8% annual risk of stroke. The patient's score is based upon: CHF History: 0 HTN History: 0 Diabetes History: 0 Stroke History: 2 Vascular Disease History: 0 Age Score: 2 Gender Score: 0   CVA in 2019.  CrCl 38mL/min Platelet count 205K  Per office protocol, patient can hold Xarelto for 1 day prior to procedure. Resume as soon as safely possible after.  **This guidance is not considered finalized until pre-operative APP has relayed final recommendations.**

## 2022-12-15 NOTE — Telephone Encounter (Signed)
Error

## 2022-12-15 NOTE — Telephone Encounter (Signed)
   Pre-operative Risk Assessment    Patient Name: Edward Boone  DOB: [birth year] MRN: [id number]      Request for Surgical Clearance    Procedure:   Pars Plana Vitrectomy, silicone oil removal  Date of Surgery:  Clearance TBD                                 Surgeon:  Dr. Sammuel Cooper Surgeon's Group or Practice Name:  [facility] Phone number:  [phone] Fax number:  [phone]   Type of Clearance Requested:   - Medical  - Pharmacy:  Hold Rivaroxaban (Xarelto) Not Indicated   Type of Anesthesia:  General    Additional requests/questions:  Called office due to May is on paperwork for clearance but no date.   Signed, Emmit Pomfret   12/15/2022, 11:55 AM

## 2022-12-15 NOTE — Telephone Encounter (Signed)
   Name: Edward Boone  DOB: 02-Apr-1946  MRN: [id number]  Primary Cardiologist: Thomasene Ripple, DO   Preoperative team, please contact this patient and set up a phone call appointment for further preoperative risk assessment. Please obtain consent and complete medication review. Thank you for your help.  I confirm that guidance regarding antiplatelet and oral anticoagulation therapy has been completed and, if necessary, noted below.  Per office protocol, patient can hold Xarelto for 1 day prior to procedure. Resume as soon as safely possible after.     Joylene Grapes, NP 12/15/2022, 3:26 PM [facility]

## 2022-12-15 NOTE — Telephone Encounter (Signed)
Lynden Ang called from office and stated after receiving a fax requesting the to provide a clearance date, they can't provide a date until the pt has been cleared first. If any questions or concerns they'd like a callback. Please advise

## 2022-12-15 NOTE — Telephone Encounter (Signed)
1st attempt to reach pt regarding surgical clearance and the need for a tele-visit.  Left pt a message to call back and ask for the preop team. 

## 2022-12-18 ENCOUNTER — Telehealth: Payer: Self-pay | Admitting: *Deleted

## 2022-12-18 ENCOUNTER — Other Ambulatory Visit: Payer: Self-pay | Admitting: *Deleted

## 2022-12-18 NOTE — Telephone Encounter (Signed)
Spoke with patient and scheduled him for a telehealth pre-op clearance appt on 12/25/2022 at 9:20 AM. Consent in and meds reviewed. Will route to requesting surgeons office to make them aware.

## 2022-12-18 NOTE — Telephone Encounter (Signed)
  Patient Consent for Virtual Visit        Edward Boone has provided verbal consent on 12/18/2022 for a virtual visit (video or telephone).   CONSENT FOR VIRTUAL VISIT FOR:  Edward Boone  By participating in this virtual visit I agree to the following:  I hereby voluntarily request, consent and authorize [facility] and its employed or contracted physicians, physician assistants, nurse practitioners or other licensed health care professionals (the Practitioner), to provide me with telemedicine health care services (the "Services") as deemed necessary by the treating Practitioner. I acknowledge and consent to receive the Services by the Practitioner via telemedicine. I understand that the telemedicine visit will involve communicating with the Practitioner through live audiovisual communication technology and the disclosure of certain medical information by electronic transmission. I acknowledge that I have been given the opportunity to request an in-person assessment or other available alternative prior to the telemedicine visit and am voluntarily participating in the telemedicine visit.  I understand that I have the right to withhold or withdraw my consent to the use of telemedicine in the course of my care at any time, without affecting my right to future care or treatment, and that the Practitioner or I may terminate the telemedicine visit at any time. I understand that I have the right to inspect all information obtained and/or recorded in the course of the telemedicine visit and may receive copies of available information for a reasonable fee.  I understand that some of the potential risks of receiving the Services via telemedicine include:  Delay or interruption in medical evaluation due to technological equipment failure or disruption; Information transmitted may not be sufficient (e.g. poor resolution of images) to allow for appropriate medical decision making by the Practitioner;  and/or  In rare instances, security protocols could fail, causing a breach of personal health information.  Furthermore, I acknowledge that it is my responsibility to provide information about my medical history, conditions and care that is complete and accurate to the best of my ability. I acknowledge that Practitioner's advice, recommendations, and/or decision may be based on factors not within their control, such as incomplete or inaccurate data provided by me or distortions of diagnostic images or specimens that may result from electronic transmissions. I understand that the practice of medicine is not an exact science and that Practitioner makes no warranties or guarantees regarding treatment outcomes. I acknowledge that a copy of this consent can be made available to me via my patient portal [facility] MyChart), or I can request a printed copy by calling the office of [facility].    I understand that my insurance will be billed for this visit.   I have read or had this consent read to me. I understand the contents of this consent, which adequately explains the benefits and risks of the Services being provided via telemedicine.  I have been provided ample opportunity to ask questions regarding this consent and the Services and have had my questions answered to my satisfaction. I give my informed consent for the services to be provided through the use of telemedicine in my medical care

## 2022-12-25 ENCOUNTER — Ambulatory Visit: Payer: Medicare Other | Attending: Cardiology | Admitting: Student

## 2022-12-25 DIAGNOSIS — Z01810 Encounter for preprocedural cardiovascular examination: Secondary | ICD-10-CM

## 2022-12-25 NOTE — Progress Notes (Signed)
Virtual Visit via Telephone Note   Because of Edward Boone's co-morbid illnesses, he is at least at moderate risk for complications without adequate follow up.  This format is felt to be most appropriate for this patient at this time.  The patient did not have access to video technology/had technical difficulties with video requiring transitioning to audio format only (telephone).  All issues noted in this document were discussed and addressed.  No physical exam could be performed with this format.  Please refer to the patient's chart for his consent to telehealth for Edward Boone.  Evaluation Performed:  Preoperative cardiovascular risk assessment _____________   Date:  12/25/2022   Patient ID:  Edward Boone, DOB 1946/07/21, MRN [id number] Patient Location:  Home Provider location:   Office  Primary Care Provider:  Noni Saupe, MD Primary Cardiologist:  Thomasene Ripple, DO  Chief Complaint / Patient Profile   77 y.o. y/o male with a h/o PAF on anticoagulation, CVA, hyperlipidemia, hypothyroidism who is pending Pars Plana Vitrectomy, silicone oil removal by Dr. Estill Boone and presents today for telephonic preoperative cardiovascular risk assessment.  History of Present Illness    Edward Boone is a 77 y.o. male who presents via audio/video conferencing for a telehealth visit today.  Pt was last seen in cardiology clinic on 03/31/2022 by Dr. Tomie China.  At that time Edward Boone was doing well.  The patient is now pending procedure as outlined above. Since his last visit, he is doing well from a cardiac standpoint. Patient denies shortness of breath or dyspnea on exertion. No chest pain, pressure, or tightness. Denies lower extremity edema, orthopnea, or PND. No palpitations. 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   Past Medical History    Past Medical History:  Diagnosis Date   Acute loss of  vision, right 05/08/2018   Anemia 03/01/2021   Atrial fibrillation (HCC)    BPH (benign prostatic hyperplasia)    BPH with obstruction/lower urinary tract symptoms 04/15/2014   Cerebrovascular accident (CVA) (HCC) 05/24/2018   Current use of long term anticoagulation 10/07/2019   Dyslipidemia    Elevated PSA    Epilepsy (HCC)    GERD (gastroesophageal reflux disease)    Heart murmur    Hypothyroidism (acquired)    Kidney stones    Long term use of drug 07/08/2021   Nephrolithiasis 06/01/2020   Paroxysmal atrial fibrillation (HCC) 09/30/2018   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 Authorizing Provider  Cyanocobalamin (B-12 COMPLIANCE INJECTION) 1000 MCG/ML KIT Inject 1,000 mcg as directed every 30 (thirty) days.    [provider]  dutasteride (AVODART) 0.5 MG capsule  05/24/16   [provider]  famotidine (PEPCID) 40 MG tablet Take 40 mg by mouth. 06/26/20   [provider]  fluticasone (FLONASE) 50 MCG/ACT nasal spray Place 2 sprays into both nostrils as needed for allergies or rhinitis. 09/26/19   [provider]  levothyroxine (SYNTHROID, LEVOTHROID) 25 MCG tablet Take 25 mcg by  mouth daily before breakfast.     [provider]  PHENObarbital (LUMINAL) 30 MG tablet Take 30-60 mg by mouth 2 (two) times daily. 30 mg qAM, 60 mg qhs 04/14/16   [provider]  phenytoin (DILANTIN) 100 MG ER capsule Take 100 mg by mouth every morning. And takes 200 mg at night.    [provider]  rivaroxaban (XARELTO) 20 MG TABS tablet Take 1 tablet (20 mg total) by mouth every morning. 12/11/22   Revankar,  Aundra Dubin, MD  rosuvastatin (CRESTOR) 20 MG tablet Take 1 tablet by mouth at bedtime. 06/26/20   [provider]  vitamin B-12 (CYANOCOBALAMIN) 1000 MCG tablet Take 1,000 mcg by mouth daily.    [provider]    Physical Exam    Vital Signs:  Edward Boone does not have vital signs available for review today.  Given telephonic nature of communication, physical exam is limited. AAOx3. NAD. Normal affect.  Speech and respirations are unlabored.  Accessory Clinical Findings    None  Assessment & Plan    Primary Cardiologist: Thomasene Ripple, DO  Preoperative cardiovascular risk assessment. Pars Plana Vitrectomy, silicone oil removal by Dr. Estill Boone.  Chart reviewed as part of pre-operative protocol coverage. According to the RCRI, patient has a 0.9% risk of MACE. Patient reports activity equivalent to >4.0 METS (walks 20 minutes 3-4 times a week).   Given past medical history and time since last visit, based on ACC/AHA guidelines, Edward Boone would be at acceptable risk for the planned procedure without further cardiovascular testing.   Patient was advised that if he develops new symptoms prior to surgery to contact our office to arrange a follow-up appointment.  he verbalized understanding.  2. Anti-coag.  Per Pharm.D.: Patient with diagnosis of afib on Xarelto for anticoagulation.     Procedure: Pars Plana Vitrectomy, silicone oil removal  Date of procedure: TBD   CHA2DS2-VASc Score = 4  This indicates a 4.8% annual risk of stroke. The patient's score is based upon: CHF History: 0 HTN History: 0 Diabetes History: 0 Stroke History: 2 Vascular Disease History: 0 Age Score: 2 Gender Score: 0   CVA in 2019.   CrCl 61mL/min Platelet count 205K   Per office protocol, patient can hold Xarelto for 1 day prior to procedure. Resume as soon as safely possible after.  I will route this recommendation to the requesting party via Epic fax function.  Please call with  questions.  Time:   Today, I have spent 6 minutes with the patient with telehealth technology discussing medical history, symptoms, and management plan.     Carlos Levering, NP  12/25/2022, 8:02 AM

## 2023-01-05 DIAGNOSIS — Z1211 Encounter for screening for malignant neoplasm of colon: Secondary | ICD-10-CM | POA: Diagnosis not present

## 2023-01-17 DIAGNOSIS — H3341 Traction detachment of retina, right eye: Secondary | ICD-10-CM | POA: Diagnosis not present

## 2023-01-17 DIAGNOSIS — H21541 Posterior synechiae (iris), right eye: Secondary | ICD-10-CM | POA: Diagnosis not present

## 2023-01-17 DIAGNOSIS — Z48810 Encounter for surgical aftercare following surgery on the sense organs: Secondary | ICD-10-CM | POA: Diagnosis not present

## 2023-01-17 DIAGNOSIS — H3521 Other non-diabetic proliferative retinopathy, right eye: Secondary | ICD-10-CM | POA: Diagnosis not present

## 2023-01-22 DIAGNOSIS — R972 Elevated prostate specific antigen [PSA]: Secondary | ICD-10-CM | POA: Diagnosis not present

## 2023-01-22 DIAGNOSIS — Z87442 Personal history of urinary calculi: Secondary | ICD-10-CM | POA: Diagnosis not present

## 2023-01-22 DIAGNOSIS — R399 Unspecified symptoms and signs involving the genitourinary system: Secondary | ICD-10-CM | POA: Diagnosis not present

## 2023-01-26 DIAGNOSIS — R569 Unspecified convulsions: Secondary | ICD-10-CM | POA: Diagnosis not present

## 2023-02-07 DIAGNOSIS — K5730 Diverticulosis of large intestine without perforation or abscess without bleeding: Secondary | ICD-10-CM | POA: Diagnosis not present

## 2023-02-07 DIAGNOSIS — Z79899 Other long term (current) drug therapy: Secondary | ICD-10-CM | POA: Diagnosis not present

## 2023-02-07 DIAGNOSIS — D123 Benign neoplasm of transverse colon: Secondary | ICD-10-CM | POA: Diagnosis not present

## 2023-02-07 DIAGNOSIS — Z1211 Encounter for screening for malignant neoplasm of colon: Secondary | ICD-10-CM | POA: Diagnosis not present

## 2023-02-07 DIAGNOSIS — K648 Other hemorrhoids: Secondary | ICD-10-CM | POA: Diagnosis not present

## 2023-02-07 DIAGNOSIS — R195 Other fecal abnormalities: Secondary | ICD-10-CM | POA: Diagnosis not present

## 2023-05-04 ENCOUNTER — Ambulatory Visit: Payer: Medicare Other | Admitting: Oncology

## 2023-05-04 ENCOUNTER — Other Ambulatory Visit: Payer: Medicare Other

## 2024-06-21 DEATH — deceased
